# Patient Record
Sex: Female | Born: 1975
Health system: Southern US, Community
[De-identification: ages and names within clinical notes are randomized; demographics above are authoritative.]

## PROBLEM LIST (undated history)

## (undated) DIAGNOSIS — Z9079 Acquired absence of other genital organ(s): Secondary | ICD-10-CM

## (undated) DIAGNOSIS — E079 Disorder of thyroid, unspecified: Secondary | ICD-10-CM

## (undated) DIAGNOSIS — C801 Malignant (primary) neoplasm, unspecified: Secondary | ICD-10-CM

## (undated) DIAGNOSIS — F32A Depression, unspecified: Secondary | ICD-10-CM

## (undated) DIAGNOSIS — F419 Anxiety disorder, unspecified: Secondary | ICD-10-CM

## (undated) DIAGNOSIS — F329 Major depressive disorder, single episode, unspecified: Secondary | ICD-10-CM

## (undated) DIAGNOSIS — T7840XA Allergy, unspecified, initial encounter: Secondary | ICD-10-CM

## (undated) DIAGNOSIS — Z789 Other specified health status: Secondary | ICD-10-CM

## (undated) HISTORY — DX: Depression, unspecified: F32.A

## (undated) HISTORY — DX: Malignant (primary) neoplasm, unspecified: C80.1

## (undated) HISTORY — DX: Major depressive disorder, single episode, unspecified: F32.9

## (undated) HISTORY — DX: Allergy, unspecified, initial encounter: T78.40XA

## (undated) HISTORY — DX: Anxiety disorder, unspecified: F41.9

## (undated) HISTORY — PX: DIAGNOSTIC LAPAROSCOPY: SUR761

## (undated) HISTORY — PX: OVARIAN CYST SURGERY: SHX726

## (undated) HISTORY — DX: Acquired absence of other genital organ(s): Z90.79

## (undated) HISTORY — DX: Disorder of thyroid, unspecified: E07.9

---

## 2007-06-04 DIAGNOSIS — C801 Malignant (primary) neoplasm, unspecified: Secondary | ICD-10-CM

## 2007-06-04 DIAGNOSIS — F419 Anxiety disorder, unspecified: Secondary | ICD-10-CM

## 2007-06-04 HISTORY — DX: Anxiety disorder, unspecified: F41.9

## 2007-06-04 HISTORY — DX: Malignant (primary) neoplasm, unspecified: C80.1

## 2010-09-27 ENCOUNTER — Inpatient Hospital Stay (HOSPITAL_COMMUNITY)
Admission: AD | Admit: 2010-09-27 | Discharge: 2010-09-29 | DRG: 774 | Disposition: A | Discharge status: Home / Self Care | Payer: PRIVATE HEALTH INSURANCE | Source: Ambulatory Visit | Attending: Obstetrics and Gynecology | Admitting: Obstetrics and Gynecology

## 2010-09-27 DIAGNOSIS — O34219 Maternal care for unspecified type scar from previous cesarean delivery: Principal | ICD-10-CM | POA: Diagnosis present

## 2010-09-27 DIAGNOSIS — O4290 Premature rupture of membranes, unspecified as to length of time between rupture and onset of labor, unspecified weeks of gestation: Secondary | ICD-10-CM | POA: Diagnosis present

## 2010-09-27 DIAGNOSIS — D649 Anemia, unspecified: Secondary | ICD-10-CM | POA: Diagnosis not present

## 2010-09-27 DIAGNOSIS — O9903 Anemia complicating the puerperium: Secondary | ICD-10-CM | POA: Diagnosis not present

## 2010-09-27 DIAGNOSIS — D696 Thrombocytopenia, unspecified: Secondary | ICD-10-CM | POA: Diagnosis not present

## 2010-09-27 DIAGNOSIS — D689 Coagulation defect, unspecified: Secondary | ICD-10-CM | POA: Diagnosis not present

## 2010-09-27 LAB — CBC
HCT: 36.4 % (ref 36.0–46.0)
MCH: 29.3 pg (ref 26.0–34.0)
MCH: 29.6 pg (ref 26.0–34.0)
MCHC: 32.2 g/dL (ref 30.0–36.0)
MCV: 91.2 fL (ref 78.0–100.0)
MCV: 91.8 fL (ref 78.0–100.0)
Platelets: 125 10*3/uL — ABNORMAL LOW (ref 150–400)
Platelets: 129 10*3/uL — ABNORMAL LOW (ref 150–400)
RBC: 3.68 MIL/uL — ABNORMAL LOW (ref 3.87–5.11)
RDW: 13.4 % (ref 11.5–15.5)

## 2010-09-28 LAB — CBC
HCT: 31.1 % — ABNORMAL LOW (ref 36.0–46.0)
MCHC: 31.8 g/dL (ref 30.0–36.0)
MCV: 92.6 fL (ref 78.0–100.0)
Platelets: 108 10*3/uL — ABNORMAL LOW (ref 150–400)
RDW: 13.6 % (ref 11.5–15.5)
WBC: 11.5 10*3/uL — ABNORMAL HIGH (ref 4.0–10.5)

## 2011-04-11 ENCOUNTER — Other Ambulatory Visit: Payer: Self-pay | Admitting: Obstetrics and Gynecology

## 2011-04-12 ENCOUNTER — Encounter (HOSPITAL_COMMUNITY): Payer: Self-pay | Admitting: *Deleted

## 2011-04-12 ENCOUNTER — Encounter (HOSPITAL_COMMUNITY): Payer: Self-pay | Admitting: Pharmacist

## 2011-04-18 NOTE — H&P (Addendum)
NAME:  Samantha Hayes, Samantha Hayes NO.:  [id number]  MEDICAL RECORD NO.:  [id number]  LOCATION:  [facility]                         FACILITY:  [facility]  PHYSICIAN:  Osborn Coho, M.D.   DATE OF BIRTH:  Oct 09, 1975  DATE OF ADMISSION:  04/10/2011 DATE OF DISCHARGE:                             HISTORY & PHYSICAL   HISTORY OF PRESENT ILLNESS:  Ms. Breslin is a 35 year old married white female, para 3-0-0-3, presenting for cold knife conization of her cervix because of CIN-I/III/carcinoma in situ of the cervix.  The patient had a history of normal Pap smears until September 2012 when her Pap smear returned showing low-grade squamous intraepithelial lesion.  The patient's colposcopy with biopsy following that Pap smear returned showing CIN-I/CIN-III/carcinoma in situ of the cervix.  The patient denies any postcoital bleeding, intermenstrual bleeding, changes in her bowel movements or urinary tract symptoms.  She does admit to random left lower quadrant pain, which she describes as dull and having occurred over the past month.  Given the patient's findings on cervical biopsy, she has consented to proceed with cold knife conization of her cervix.  PAST MEDICAL HISTORY:  OB history gravida 3, para 3-0-0-3.  The patient had a cesarean section in 2005 and spontaneous vaginal deliveries in 2002 and 2012.  The patient is currently breast feeding.  GYN HISTORY:  Menarche 35 years old.  Last menstrual period March 24, 2011.  She is using Lo Loestrin as her method of contraception.  She has a history of the human papilloma virus, however, has not had abnormal Pap smears until her current Pap smear as described in history of present illness.  MEDICAL HISTORY:  Asthma and depression.  SURGICAL HISTORY:  2009, laparoscopic left ovarian cystectomy.  Denies any problems with anesthesia or history of blood transfusions.  FAMILY HISTORY:  Asthma, thyroid disease, hypertension, and  diabetes.  SOCIAL HISTORY:  The patient is married and she works as a Scientist, forensic.  HABITS:  She does not use tobacco, alcohol, or illicit drugs.  CURRENT MEDICATIONS:  Lo Loestrin, Fenugreek, and prenatal vitamins.  ALLERGIES:  She has no known drug allergies.  Denies any sensitivities to peanuts, latex, shellfish, or soy.  REVIEW OF SYSTEMS:  The patient does wear reading glasses.  Denies any headache, vision changes, difficulty swallowing, nasal congestion, hearing difficulties, nausea, vomiting or diarrhea, dysuria, hematuria, urinary urgency or frequency, flank pain, myalgias, arthralgias, skin rashes.  She does admit to random pelvic pain, but except as is mentioned in history of present illness, the patient's review of systems is otherwise negative.  PHYSICAL EXAMINATION:  VITAL SIGNS:  Blood pressure 100/68, pulse is 72, respirations 16, temperature 98.1 degrees Fahrenheit orally, weight 157 pounds.  Height 5 feet, 6-1/2 inches tall, body mass index 25.3. NECK:  Supple without masses.  There is no thyromegaly or cervical adenopathy. HEART:  Regular rate and rhythm. LUNGS:  Clear. BACK:  No CVA tenderness. ABDOMEN:  No tenderness, guarding, rebound, masses, or organomegaly. EXTREMITIES:  No clubbing, cyanosis, or edema. PELVIC:  EGBUS is normal.  Vagina shows mild pelvic relaxation.  Cervix is nontender without lesions.  Uterus appears normal size, shape, and consistency without tenderness.  Adnexa without tenderness or masses.  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  DISPOSITION:  A discussion was held with the patient regarding indications for her procedure along with its risks, which include, but are not limited to reaction to anesthesia, damage to adjacent organs, excessive bleeding, infection, scarring, and cervical incompetence.  The patient verbalized understanding of these risks, but has consented  to proceed with a cold knife conization of her cervix on April 22, 2011, at 11:30 a.m.     Savvas Roper J. Lowell Guitar, P.A.-C   ______________________________ Osborn Coho, M.D.    EJP/MEDQ  D:  04/18/2011  T:  04/18/2011  Job:  [id number]  04/22/11 No change in H&P - AYR

## 2011-04-22 ENCOUNTER — Ambulatory Visit (HOSPITAL_COMMUNITY)
Admission: RE | Admit: 2011-04-22 | Discharge: 2011-04-22 | Disposition: A | Discharge status: Home / Self Care | Payer: BC Managed Care – PPO | Source: Ambulatory Visit | Attending: Obstetrics and Gynecology | Admitting: Obstetrics and Gynecology

## 2011-04-22 ENCOUNTER — Other Ambulatory Visit: Payer: Self-pay | Admitting: Obstetrics and Gynecology

## 2011-04-22 ENCOUNTER — Encounter (HOSPITAL_COMMUNITY)
Admission: RE | Disposition: A | Discharge status: Home / Self Care | Payer: Self-pay | Source: Ambulatory Visit | Attending: Obstetrics and Gynecology

## 2011-04-22 ENCOUNTER — Ambulatory Visit (HOSPITAL_COMMUNITY): Payer: BC Managed Care – PPO | Admitting: Anesthesiology

## 2011-04-22 ENCOUNTER — Encounter (HOSPITAL_COMMUNITY): Payer: Self-pay | Admitting: Anesthesiology

## 2011-04-22 ENCOUNTER — Encounter (HOSPITAL_COMMUNITY): Payer: Self-pay | Admitting: *Deleted

## 2011-04-22 DIAGNOSIS — D069 Carcinoma in situ of cervix, unspecified: Secondary | ICD-10-CM | POA: Insufficient documentation

## 2011-04-22 HISTORY — DX: Other specified health status: Z78.9

## 2011-04-22 HISTORY — PX: CERVICAL CONIZATION W/BX: SHX1330

## 2011-04-22 LAB — HCG, SERUM, QUALITATIVE: Preg, Serum: NEGATIVE

## 2011-04-22 LAB — CBC
Hemoglobin: 13 g/dL (ref 12.0–15.0)
MCH: 30.8 pg (ref 26.0–34.0)
MCHC: 32.7 g/dL (ref 30.0–36.0)
Platelets: 211 10*3/uL (ref 150–400)
RBC: 4.22 MIL/uL (ref 3.87–5.11)

## 2011-04-22 SURGERY — CONE BIOPSY, CERVIX
Anesthesia: General | Site: Vagina | Wound class: Clean Contaminated

## 2011-04-22 MED ORDER — ACETIC ACID 4% SOLUTION
Status: DC | PRN
  Administered 2011-04-22: 1 via TOPICAL

## 2011-04-22 MED ORDER — FENTANYL CITRATE 0.05 MG/ML IJ SOLN
INTRAMUSCULAR | Status: AC
  Filled 2011-04-22: qty 5

## 2011-04-22 MED ORDER — LIDOCAINE HCL (CARDIAC) 20 MG/ML IV SOLN
INTRAVENOUS | Status: DC | PRN
  Administered 2011-04-22: 40 mg via INTRAVENOUS

## 2011-04-22 MED ORDER — LACTATED RINGERS IV SOLN
INTRAVENOUS | Status: DC
  Administered 2011-04-22 (×2): via INTRAVENOUS

## 2011-04-22 MED ORDER — MIDAZOLAM HCL 2 MG/2ML IJ SOLN
INTRAMUSCULAR | Status: AC
  Filled 2011-04-22: qty 2

## 2011-04-22 MED ORDER — LIDOCAINE HCL (CARDIAC) 20 MG/ML IV SOLN
INTRAVENOUS | Status: AC
  Filled 2011-04-22: qty 5

## 2011-04-22 MED ORDER — FENTANYL CITRATE 0.05 MG/ML IJ SOLN
INTRAMUSCULAR | Status: AC
  Filled 2011-04-22: qty 2

## 2011-04-22 MED ORDER — ONDANSETRON HCL 4 MG/2ML IJ SOLN
INTRAMUSCULAR | Status: AC
  Filled 2011-04-22: qty 2

## 2011-04-22 MED ORDER — IBUPROFEN 600 MG PO TABS: 600.0000 mg | ORAL_TABLET | Freq: Four times a day (QID) | ORAL | Status: AC | PRN

## 2011-04-22 MED ORDER — FENTANYL CITRATE 0.05 MG/ML IJ SOLN
25.0000 ug | INTRAMUSCULAR | Status: DC | PRN
  Administered 2011-04-22: 50 ug via INTRAVENOUS

## 2011-04-22 MED ORDER — PROPOFOL 10 MG/ML IV EMUL
INTRAVENOUS | Status: DC | PRN
  Administered 2011-04-22: 200 mg via INTRAVENOUS

## 2011-04-22 MED ORDER — MIDAZOLAM HCL 5 MG/5ML IJ SOLN
INTRAMUSCULAR | Status: DC | PRN
  Administered 2011-04-22: 2 mg via INTRAVENOUS

## 2011-04-22 MED ORDER — LUGOLS 5 % PO SOLN
ORAL | Status: DC | PRN
  Administered 2011-04-22: 0.1 mL via ORAL

## 2011-04-22 MED ORDER — KETOROLAC TROMETHAMINE 30 MG/ML IJ SOLN
INTRAMUSCULAR | Status: DC | PRN
  Administered 2011-04-22: 30 mg via INTRAVENOUS

## 2011-04-22 MED ORDER — ONDANSETRON HCL 4 MG/2ML IJ SOLN
INTRAMUSCULAR | Status: DC | PRN
  Administered 2011-04-22: 4 mg via INTRAVENOUS

## 2011-04-22 MED ORDER — FENTANYL CITRATE 0.05 MG/ML IJ SOLN
INTRAMUSCULAR | Status: DC | PRN
  Administered 2011-04-22: 100 ug via INTRAVENOUS
  Administered 2011-04-22: 25 ug via INTRAVENOUS

## 2011-04-22 MED ORDER — FERRIC SUBSULFATE SOLN
Status: DC | PRN
  Administered 2011-04-22: 1 via TOPICAL

## 2011-04-22 MED ORDER — DEXAMETHASONE SODIUM PHOSPHATE 4 MG/ML IJ SOLN
INTRAMUSCULAR | Status: DC | PRN
  Administered 2011-04-22: 10 mg via INTRAVENOUS

## 2011-04-22 MED ORDER — PROPOFOL 10 MG/ML IV EMUL
INTRAVENOUS | Status: AC
  Filled 2011-04-22: qty 20

## 2011-04-22 MED ORDER — HYDROCODONE-ACETAMINOPHEN 5-500 MG PO TABS: 1.0000 | ORAL_TABLET | Freq: Four times a day (QID) | ORAL | Status: AC | PRN

## 2011-04-22 MED ORDER — VASOPRESSIN 20 UNIT/ML IJ SOLN
INTRAVENOUS | Status: DC | PRN
  Administered 2011-04-22: 12:00:00 via INTRAMUSCULAR

## 2011-04-22 MED ORDER — DEXAMETHASONE SODIUM PHOSPHATE 10 MG/ML IJ SOLN
INTRAMUSCULAR | Status: AC
  Filled 2011-04-22: qty 1

## 2011-04-22 SURGICAL SUPPLY — 30 items
APPLICATOR COTTON TIP 6IN STRL (MISCELLANEOUS) ×6 IMPLANT
BLADE SURG 11 STRL SS (BLADE) ×2 IMPLANT
CLOTH BEACON ORANGE TIMEOUT ST (SAFETY) ×2 IMPLANT
CONTAINER PREFILL 10% NBF 60ML (FORM) ×2 IMPLANT
COUNTER NEEDLE 1200 MAGNETIC (NEEDLE) IMPLANT
DRAPE UTILITY XL STRL (DRAPES) IMPLANT
ELECT BALL LEEP 5MM RED (ELECTRODE) ×2 IMPLANT
ELECT REM PT RETURN 9FT ADLT (ELECTROSURGICAL) ×2
ELECTRODE REM PT RTRN 9FT ADLT (ELECTROSURGICAL) ×1 IMPLANT
GAUZE SPONGE 4X4 16PLY XRAY LF (GAUZE/BANDAGES/DRESSINGS) IMPLANT
GLOVE BIO SURGEON STRL SZ7.5 (GLOVE) ×4 IMPLANT
GLOVE BIOGEL PI IND STRL 7.5 (GLOVE) ×1 IMPLANT
GLOVE BIOGEL PI INDICATOR 7.5 (GLOVE) ×1
GOWN PREVENTION PLUS LG XLONG (DISPOSABLE) ×4 IMPLANT
NEEDLE SPNL 22GX3.5 QUINCKE BK (NEEDLE) ×2 IMPLANT
PACK VAGINAL MINOR WOMEN LF (CUSTOM PROCEDURE TRAY) ×2 IMPLANT
PENCIL BUTTON HOLSTER BLD 10FT (ELECTRODE) ×2 IMPLANT
SCOPETTES 8  STERILE (MISCELLANEOUS) ×2
SCOPETTES 8 STERILE (MISCELLANEOUS) ×2 IMPLANT
SPONGE SURGIFOAM ABS GEL 12-7 (HEMOSTASIS) ×2 IMPLANT
SUT VIC AB 0 CT1 18XCR BRD8 (SUTURE) ×1 IMPLANT
SUT VIC AB 0 CT1 27 (SUTURE) ×2
SUT VIC AB 0 CT1 27XBRD ANTBC (SUTURE) ×2 IMPLANT
SUT VIC AB 0 CT1 8-18 (SUTURE) ×1
SYR CONTROL 10ML LL (SYRINGE) ×4 IMPLANT
SYR TB 1ML 27GX1/2 SAFE (SYRINGE) ×1 IMPLANT
SYR TB 1ML 27GX1/2 SAFETY (SYRINGE) ×1
TOWEL OR 17X24 6PK STRL BLUE (TOWEL DISPOSABLE) ×4 IMPLANT
TUBING NON-CON 1/4 X 20 CONN (TUBING) ×2 IMPLANT
YANKAUER SUCT BULB TIP NO VENT (SUCTIONS) ×2 IMPLANT

## 2011-04-22 NOTE — Anesthesia Preprocedure Evaluation (Signed)
Anesthesia Evaluation  Patient identified by MRN, date of birth, ID band Patient awake    Reviewed: Allergy & Precautions, H&P , NPO status , Patient's Chart, lab work & pertinent test results, reviewed documented beta blocker date and time   History of Anesthesia Complications Negative for: history of anesthetic complications  Airway Mallampati: I TM Distance: >3 FB Neck ROM: full    Dental  (+) Teeth Intact and Partial Upper   Pulmonary neg pulmonary ROS,  clear to auscultation  Pulmonary exam normal       Cardiovascular Exercise Tolerance: Good neg cardio ROS regular Normal    Neuro/Psych Negative Neurological ROS  Negative Psych ROS   GI/Hepatic negative GI ROS, Neg liver ROS,   Endo/Other  Negative Endocrine ROS  Renal/GU negative Renal ROS  Genitourinary negative   Musculoskeletal   Abdominal   Peds  Hematology negative hematology ROS (+)   Anesthesia Other Findings   Reproductive/Obstetrics (+) Breast feeding                            Anesthesia Physical Anesthesia Plan  ASA: I  Anesthesia Plan: General   Post-op Pain Management:    Induction:   Airway Management Planned: LMA  Additional Equipment:   Intra-op Plan:   Post-operative Plan:   Informed Consent: I have reviewed the patients History and Physical, chart, labs and discussed the procedure including the risks, benefits and alternatives for the proposed anesthesia with the patient or authorized representative who has indicated his/her understanding and acceptance.   Dental Advisory Given  Plan Discussed with: CRNA and Surgeon  Anesthesia Plan Comments:         Anesthesia Quick Evaluation

## 2011-04-22 NOTE — Op Note (Signed)
Preop Diagnosis: CIN I, CIN III, CIS   Postop Diagnosis: CIN I, CIN III, CIS   Procedure: CONIZATION CERVIX WITH BIOPSY   Anesthesia: General   Anesthesiologist: Amy L. Rodman Pickle, MD   Attending: Purcell Nails, MD   Assistant: N/A  Findings: AW lesion around entire transformation zone  Pathology: Cervical Cone Specimen with stitch at 12 O'Clock.   Fluids: 1700cc  UOP: QS via straight cath prior to procedure  EBL: Minimal  Complications: None  Procedure: The patient was taken to the operating room after the risks, benefits and alternatives were discussed with the patient. The patient verbalized understanding and consent signed and witnessed. The patient was placed under general anesthesia per anesthesiologist and prepped and draped in the normal sterile fashion.  A TIME OUT was performed per protocol.  A bivalve speculum was placed in the patient's vagina and 2 stitches were placed, one at the 3:00 position and one at the 9:00 position. Colposcopy was performed and acetowhite lesions as noted above.  A total of 10 cc of dilute Pitressin was injected in cervix circumferentially. There was 20 units of Pitressin in 100 cc of normal saline. Cold knife cone was performed without difficulty and a stitch placed at 12:00 and specimen sent to pathology. The base of the cervix was cauterized with ball-tipped cautery. Monsel solution was placed to aid hemostasis.  All instruments were removed. Sponge lap and needle count was correct. Patient tolerated the procedure well and was awaiting transfer to the recovery room in good condition.

## 2011-04-22 NOTE — Transfer of Care (Signed)
Immediate Anesthesia Transfer of Care Note  Patient: Samantha Hayes  Procedure(s) Performed:  CONIZATION CERVIX WITH BIOPSY  Patient Location: PACU  Anesthesia Type: General  Level of Consciousness: awake  Airway & Oxygen Therapy: Patient Spontanous Breathing  Post-op Assessment: Report given to PACU RN  Post vital signs: stable  Complications: No apparent anesthesia complications

## 2011-04-23 ENCOUNTER — Encounter (HOSPITAL_COMMUNITY): Payer: Self-pay | Admitting: Obstetrics and Gynecology

## 2011-04-23 NOTE — Anesthesia Postprocedure Evaluation (Signed)
Anesthesia Post Note  Patient: Samantha Hayes  Procedure(s) Performed:  CONIZATION CERVIX WITH BIOPSY  Anesthesia type: General  Patient location: PACU  Post pain: Pain level controlled  Post assessment: Post-op Vital signs reviewed  Last Vitals:  Filed Vitals:   04/22/11 1445  BP:   Pulse:   Temp: 36.8 C  Resp:     Post vital signs: Reviewed  Level of consciousness: sedated  Complications: No apparent anesthesia complications

## 2011-08-27 ENCOUNTER — Encounter (INDEPENDENT_AMBULATORY_CARE_PROVIDER_SITE_OTHER): Payer: BC Managed Care – PPO | Admitting: Obstetrics and Gynecology

## 2011-08-27 DIAGNOSIS — O99345 Other mental disorders complicating the puerperium: Secondary | ICD-10-CM

## 2011-08-27 DIAGNOSIS — N870 Mild cervical dysplasia: Secondary | ICD-10-CM

## 2011-08-27 DIAGNOSIS — D069 Carcinoma in situ of cervix, unspecified: Secondary | ICD-10-CM

## 2011-09-12 ENCOUNTER — Telehealth: Payer: Self-pay | Admitting: Obstetrics and Gynecology

## 2011-09-16 NOTE — Telephone Encounter (Signed)
LM for pt that pap results were ASCUS, but HPV not detected. I told her I'd show results to Dr. Su Hilt for plan of care and get back to her. JO CMA

## 2011-09-16 NOTE — Telephone Encounter (Signed)
cht to [employer]

## 2011-09-18 ENCOUNTER — Telehealth: Payer: Self-pay

## 2011-09-18 NOTE — Telephone Encounter (Signed)
LM again for pt about recent pap results. ASCUS/ HPV not  detected. I had previously told pt I would ask Dr. Su Hilt when she'd like to repeat pap, but then I found the ans. To ? In the chart. Last OV note said repeat pap in 4-6 months. I will still show AR result and if plan is any different, then I will contact pt. Again.  JO CMA

## 2011-09-23 ENCOUNTER — Telehealth: Payer: Self-pay

## 2011-09-23 NOTE — Telephone Encounter (Signed)
LM for pt that she will need repeat pap in 4 months, per AR. Melody Comas A

## 2011-11-12 ENCOUNTER — Ambulatory Visit: Payer: BC Managed Care – PPO | Admitting: Obstetrics and Gynecology

## 2012-02-11 ENCOUNTER — Encounter: Payer: Self-pay | Admitting: Obstetrics and Gynecology

## 2012-02-11 ENCOUNTER — Ambulatory Visit (INDEPENDENT_AMBULATORY_CARE_PROVIDER_SITE_OTHER): Payer: BC Managed Care – PPO | Admitting: Obstetrics and Gynecology

## 2012-02-11 VITALS — BP 102/64 | Resp 16 | Ht 66.5 in | Wt 169.0 lb

## 2012-02-11 DIAGNOSIS — N940 Mittelschmerz: Secondary | ICD-10-CM

## 2012-02-11 DIAGNOSIS — IMO0001 Reserved for inherently not codable concepts without codable children: Secondary | ICD-10-CM

## 2012-02-11 DIAGNOSIS — R87612 Low grade squamous intraepithelial lesion on cytologic smear of cervix (LGSIL): Secondary | ICD-10-CM

## 2012-02-11 DIAGNOSIS — D069 Carcinoma in situ of cervix, unspecified: Secondary | ICD-10-CM | POA: Insufficient documentation

## 2012-02-11 DIAGNOSIS — Z309 Encounter for contraceptive management, unspecified: Secondary | ICD-10-CM

## 2012-02-11 MED ORDER — NORETHIN-ETH ESTRAD-FE BIPHAS 1 MG-10 MCG / 10 MCG PO TABS: 1.0000 | ORAL_TABLET | Freq: Every day | ORAL | Status: DC

## 2012-02-11 NOTE — Addendum Note (Signed)
Addended by: Osborn Coho on: 02/11/2012 09:47 AM   Modules accepted: Level of Service

## 2012-02-11 NOTE — Progress Notes (Addendum)
H/o Cin3/cis s/p CKC 04/2011 Wants refill of OCPs C/o midcycle ovulatory discomfort spont resolution  Filed Vitals:   02/11/12 0921  BP: 102/64  Resp: 16   ROS: noncontributory  Pelvic exam:  VULVA: normal appearing vulva with no masses, tenderness or lesions,  VAGINA: normal appearing vagina with normal color and discharge, no lesions, CERVIX: normal appearing cervix without discharge or lesions,  UTERUS: uterus is normal size, shape, consistency and nontender,  ADNEXA: normal adnexa in size, nontender and no masses.   A/P AEX and repeat pap in Refill Loestrin 24 Fe If pain recurs and persists, sched u/s (pt reports h/o cysts) - likely mittelschmerz returning with decline in BF'ing.

## 2012-02-12 LAB — PAP IG W/ RFLX HPV ASCU

## 2012-02-18 ENCOUNTER — Telehealth: Payer: Self-pay | Admitting: Obstetrics and Gynecology

## 2012-02-18 NOTE — Telephone Encounter (Signed)
TRIAGE/BC REQ

## 2012-02-19 ENCOUNTER — Telehealth: Payer: Self-pay

## 2012-02-19 NOTE — Telephone Encounter (Signed)
Spoke to pharmacist about [facility] RX needing prior auth. Per protocol/ AR, pt can have whatever will be covered by her insurance. Microgestin FE was replaced for other pill that was not covered. Sig 1 po qd as directed w/ RF's through 02/2013.  Pt notified. Samantha Hayes A

## 2012-02-27 ENCOUNTER — Telehealth: Payer: Self-pay | Admitting: Obstetrics and Gynecology

## 2012-02-27 NOTE — Telephone Encounter (Signed)
Spoke with pt rgd pap informed pap wnl repeat pap in 6 months per AR note pt voice understanding

## 2014-06-03 DIAGNOSIS — E079 Disorder of thyroid, unspecified: Secondary | ICD-10-CM

## 2014-06-03 HISTORY — DX: Disorder of thyroid, unspecified: E07.9

## 2017-01-27 ENCOUNTER — Ambulatory Visit (INDEPENDENT_AMBULATORY_CARE_PROVIDER_SITE_OTHER): Payer: BLUE CROSS/BLUE SHIELD | Admitting: Family Medicine

## 2017-01-27 ENCOUNTER — Encounter: Payer: Self-pay | Admitting: Family Medicine

## 2017-01-27 VITALS — BP 108/63 | HR 73 | Temp 98.3°F | Ht 66.5 in | Wt 187.0 lb

## 2017-01-27 DIAGNOSIS — E039 Hypothyroidism, unspecified: Secondary | ICD-10-CM

## 2017-01-27 DIAGNOSIS — F320 Major depressive disorder, single episode, mild: Secondary | ICD-10-CM | POA: Diagnosis not present

## 2017-01-27 MED ORDER — LEVOTHYROXINE SODIUM 75 MCG PO TABS: 75.0000 ug | ORAL_TABLET | Freq: Every day | ORAL | 3 refills | Status: DC

## 2017-01-27 MED ORDER — BUPROPION HCL ER (XL) 150 MG PO TB24: 150.0000 mg | ORAL_TABLET | Freq: Every day | ORAL | 2 refills | Status: DC

## 2017-01-27 NOTE — Progress Notes (Signed)
BP 108/63   Pulse 73   Temp 98.3 F (36.8 C) (Oral)   Ht 5' 6.5" (1.689 m)   Wt 187 lb (84.8 kg)   BMI 29.73 kg/m    Subjective:    Patient ID: Samantha Hayes, female    DOB: 08/15/1975, 41 y.o.   MRN: [id number]  HPI: Samantha Hayes is a 41 y.o. 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  out of synthroid x 2 months.  having problems with anxiety/depression - had been on zoloft in past, still nursing some)   HPI Hypothyroidism Patient is coming in for thyroid recheck today as well. They deny any issues with hair changes or heat or cold problems or diarrhea or constipation. They deny any chest pain or palpitations. They are currently on levothyroxine 75 micrograms, she has been out of her medication for 2 months and has been having increased issues with anxiety and depression and fatigue.   Major depression Patient is coming in to discuss major depression. She says she's been dealing with this or at least 3 or 4 months, she had issues when she was younger but has never been on medications and would like to discuss possibilities. She denies any suicidal ideations but has been feeling very anxious and feelings of depression and sadness and fatigue. Depression screen PHQ 2/9 01/27/2017  Decreased Interest 1  Down, Depressed, Hopeless 1  PHQ - 2 Score 2  Altered sleeping 0  Tired, decreased energy 1  Change in appetite 0  Feeling bad or failure about yourself  0  Trouble concentrating 2  Moving slowly or fidgety/restless 0  Suicidal thoughts 0  PHQ-9 Score 5  Difficult doing work/chores Not difficult at all     Relevant past medical, surgical, family and social history reviewed and updated as indicated. Interim medical history since our last visit reviewed. Allergies and medications reviewed and updated.  Review of Systems  Constitutional: Positive for fatigue. Negative for  chills and fever.  Respiratory: Negative for chest tightness and shortness of breath.   Cardiovascular: Negative for chest pain and leg swelling.  Musculoskeletal: Negative for back pain and gait problem.  Skin: Negative for rash.  Neurological: Negative for dizziness, light-headedness and headaches.  Psychiatric/Behavioral: Positive for decreased concentration and dysphoric mood. Negative for agitation, behavioral problems, self-injury, sleep disturbance and suicidal ideas. The patient is nervous/anxious.   All other systems reviewed and are negative.  Per HPI unless specifically indicated above  Social History   Social History  . Marital status: Married    Spouse name: N/A  . Number of children: N/A  . Years of education: N/A   Occupational History  . Not on file.   Social History Main Topics  . Smoking status: Never Smoker  . Smokeless tobacco: Never Used  . Alcohol use No  . Drug use: No  . Sexual activity: Yes    Birth control/ protection: Other-see comments     Comment: tubes removed   Other Topics Concern  . Not on file   Social History Narrative  . No narrative on file    Past Surgical History:  Procedure Laterality Date  . CERVICAL CONIZATION W/BX  04/22/2011   Procedure: CONIZATION CERVIX WITH BIOPSY;  Surgeon: Delice Lesch, MD;  Location: [facility];  Service: Gynecology;  Laterality: N/A;  . CESAREAN SECTION  2005  . DIAGNOSTIC LAPAROSCOPY    .  OVARIAN CYST SURGERY Left     Family History  Problem Relation Age of Onset  . Asthma Mother   . Depression Mother   . Diabetes Mother   . Vision loss Maternal Grandmother   . Kidney disease Maternal Grandfather     Allergies as of 01/27/2017   No Known Allergies     Medication List       Accurate as of 01/27/17  9:30 AM. Always use your most recent med list.          buPROPion 150 MG 24 hr tablet Commonly known as:  WELLBUTRIN XL Take 1 tablet (150 mg total) by mouth daily.   levothyroxine 75  MCG tablet Commonly known as:  SYNTHROID, LEVOTHROID Take 1 tablet (75 mcg total) by mouth daily.            Discharge Care Instructions        Start     Ordered   01/27/17 0000  levothyroxine (SYNTHROID, LEVOTHROID) 75 MCG tablet  Daily     01/27/17 0928   01/27/17 0000  buPROPion (WELLBUTRIN XL) 150 MG 24 hr tablet  Daily     01/27/17 0928         Objective:    BP 108/63   Pulse 73   Temp 98.3 F (36.8 C) (Oral)   Ht 5' 6.5" (1.689 m)   Wt 187 lb (84.8 kg)   BMI 29.73 kg/m   Wt Readings from Last 3 Encounters:  01/27/17 187 lb (84.8 kg)  02/11/12 169 lb (76.7 kg)  04/22/11 144 lb (65.3 kg)    Physical Exam  Constitutional: She is oriented to person, place, and time. She appears well-developed and well-nourished. No distress.  Eyes: Conjunctivae are normal.  Neck: Neck supple. No thyromegaly present.  Cardiovascular: Normal rate, regular rhythm, normal heart sounds and intact distal pulses.   No murmur heard. Pulmonary/Chest: Effort normal and breath sounds normal. No respiratory distress. She has no wheezes. She has no rales.  Musculoskeletal: Normal range of motion.  Lymphadenopathy:    She has no cervical adenopathy.  Neurological: She is alert and oriented to person, place, and time. Coordination normal.  Skin: Skin is warm and dry. No rash noted. She is not diaphoretic.  Psychiatric: Her behavior is normal. Judgment normal. Her mood appears anxious. She exhibits a depressed mood. She expresses no suicidal ideation. She expresses no suicidal plans.  Nursing note and vitals reviewed.     Assessment & Plan:   Problem List Items Addressed This Visit      Endocrine   Hypothyroidism   Relevant Medications   levothyroxine (SYNTHROID, LEVOTHROID) 75 MCG tablet    Other Visit Diagnoses    Depression, major, single episode, mild ([place])    -  Primary   Relevant Medications   buPROPion (WELLBUTRIN XL) 150 MG 24 hr tablet       Follow up plan: Return in  about 6 weeks (around 03/10/2017), or if symptoms worsen or fail to improve, for depression and thyroid recheck.  Caryl Pina, MD [facility] 01/27/2017, 9:30 AM

## 2017-02-11 NOTE — Progress Notes (Signed)
Subjective: CC:?poison oak PCP: Dettinger, Fransisca Kaufmann, MD Samantha Hayes is a 41 y.o. female presenting to clinic today for:  1. Rash Patient reports that she was pulling weeds last Thursday and developed a rash on bilateral forearms on Saturday. She notes that vesicles formed over the weekend. She has been applying over-the-counter topical poison ivy/poison oak treatment. She also washed with a poison ivy soap. Neither of these therapies have been useful. She notes intense pruritus, citing that it is keeping her awake at nighttime. No other lesions elsewhere on the body. She notes is the first time she's ever had poison oak. No shortness of breath, fevers, chills. She did have some nausea after an endometrial biopsy earlier this week. She also notes that one of the lesions on the left forearm has some redness that has been spreading. 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 Currently she has not been working since the birth of her daughter. No exposure to MRSA that she knows of. No known drug allergies. Past medical history significant for hypothyroidism and depression. She is on Synthroid and Wellbutrin for this. No other meds. Additionally, she is still actively breast-feeding her daughter.  No Known Allergies Past Medical History:  Diagnosis Date  . Allergy   . Anxiety 2009  . Cancer [facility]) 2009   cervical  . Depression   . No pertinent past medical history   . Status post surgical removal of both fallopian tubes   . Thyroid disease 2016   Family History  Problem Relation Age of Onset  . Asthma Mother   . Depression Mother   . Diabetes Mother   . Vision loss Maternal Grandmother   . Kidney disease Maternal Grandfather    Social Hx: non smoker.Current medications reviewed.   ROS: Per HPI  Objective: Office vital signs reviewed. BP 104/62   Pulse 75   Temp 98 F (36.7 C) (Oral)   Ht 5' 6.5" (1.689 m)   Wt 184 lb (83.5 kg)   BMI 29.25 kg/m    Physical Examination:  General: Awake, alert, well nourished, nontoxic appearing, No acute distress HEENT: Normal    Throat: moist mucus membranes, no erythema, no tonsillar exudate.  Airway is patent Cardio: regular rate, +2 DP Pulm: normal work of breathing on room air Extremities: warm, well perfused, No edema, cyanosis or clubbing; +2 pulses bilaterally. See skin below Skin: dry; cluster of vesicles appreciated on the left mid-ventral forearm w/ surrounding area of erythema (~2 inches in diameter), she has 2 smaller vesicles along the ulnar aspect of left forearm. Right forearm with several small vesicles with no associated erythema.  No purulence from any of the lesions.  No crust formation.  Nonbleeding. Nontender. Nonfluctuant. Nonindurated.   Assessment/ Plan: 41 y.o. female   1. Allergic contact dermatitis due to plants, except food Concern for poison oak dermatitis. Because vesicles or artery formed, hypodensity steroid would be of little use at this point. It sounds like her symptoms are keeping her up at nighttime/interfering with daily life. Therefore, a long taper of oral steroids were prescribed. She is breast-feeding and I advised her that she should wait 4 hours after taking medication before breast-feeding. I advised to keep area clean.  I informed her that poison oak often does not spread person-to-person once the plant oils have been cleared from skin. She may go back to her normal routine. Additionally, I have concern for early cellulitis on the left upper chest that  he see below. - predniSONE (DELTASONE) 20 MG tablet; Take 2 tablets (40mg ) by mouth x7 days, then 1 tablet (20mg ) x7 days, then 1/2 tab (10mg ) x7days. Then stop.  Dispense: 25 tablet; Refill: 0  2. Cellulitis of left upper extremity She is afebrile on exam. Vital signs stable. No evidence of systemic infection. Left forearm lesion with mild to moderate erythema surrounding vesicle. There is no purulence,  increased warmth, bleeding, or tenderness to palpation. At this point, she does not have a large risk for MRSA exposure. Therefore, I have prescribed her Keflex to take 4 times a day for the next 5 days. This should be safe with breast-feeding. Should return precautions were reviewed with patient. She voiced good understanding. She will follow up as needed with primary care doctor. - cephALEXin (KEFLEX) 500 MG capsule; Take 1 capsule (500 mg total) by mouth 4 (four) times daily.  Dispense: 20 capsule; Refill: 0   Meds ordered this encounter  Medications  . predniSONE (DELTASONE) 20 MG tablet    Sig: Take 2 tablets (40mg ) by mouth x7 days, then 1 tablet (20mg ) x7 days, then 1/2 tab (10mg ) x7days. Then stop.    Dispense:  25 tablet    Refill:  0  . cephALEXin (KEFLEX) 500 MG capsule    Sig: Take 1 capsule (500 mg total) by mouth 4 (four) times daily.    Dispense:  20 capsule    Refill:  [place], DO [place] [phone]

## 2017-02-12 ENCOUNTER — Ambulatory Visit (INDEPENDENT_AMBULATORY_CARE_PROVIDER_SITE_OTHER): Payer: BLUE CROSS/BLUE SHIELD | Admitting: Family Medicine

## 2017-02-12 VITALS — BP 104/62 | HR 75 | Temp 98.0°F | Ht 66.5 in | Wt 184.0 lb

## 2017-02-12 DIAGNOSIS — L237 Allergic contact dermatitis due to plants, except food: Secondary | ICD-10-CM | POA: Diagnosis not present

## 2017-02-12 DIAGNOSIS — L03114 Cellulitis of left upper limb: Secondary | ICD-10-CM | POA: Diagnosis not present

## 2017-02-12 MED ORDER — PREDNISONE 20 MG PO TABS: ORAL_TABLET | ORAL | 0 refills | Status: DC

## 2017-02-12 MED ORDER — CEPHALEXIN 500 MG PO CAPS: 500.0000 mg | ORAL_CAPSULE | Freq: Four times a day (QID) | ORAL | 0 refills | Status: AC

## 2017-02-12 NOTE — Patient Instructions (Addendum)
I have sent in a long taper of oral steroids and an oral antibiotic for what appears to be an early cellulitis on the left arm. I recommended taking the oral steroid each morning, as this can cause insomnia. This may also cause increased appetite. If you notices worsening redness, swelling, pain, pus, please seek immediate medical attention. Otherwise, know that during the first poison oak reaction symptoms may persist for a full 3-4 weeks, especially since the vesicles have already formed.   Poison Oak Dermatitis Poison oak dermatitis is inflammation of the skin that is caused by contact with the allergens on the leaves of the poison oak (toxicodendron) plant. The skin reaction often includes redness, swelling, blisters, and extreme itching. What are the causes? This condition is caused by a specific chemical (urushiol) that is found in the sap of the poison oak plant. This chemical is sticky and it can be easily spread to people, animals, and objects. You can get poison oak dermatitis by:  Having direct contact with a poison oak plant.  Touching animals, other people, or objects that have come in contact with poison oak and have the chemical on them.  What increases the risk? This condition is more likely to develop in people who:  Are outdoors often.  Go outdoors without wearing protective clothing, such as closed shoes, long pants, and a long-sleeved shirt.  What are the signs or symptoms? Symptoms of this condition include:  Redness of the skin.  A rash that may develop blisters.  Extreme itching.  Swelling. This may occur if the reaction is more severe.  Symptoms usually last for 1-2 weeks. However, in some cases, symptoms may last 3-4 weeks.  How is this diagnosed? This condition may be diagnosed based on your symptoms and a physical exam. Your health care provider may also ask you about any recent outdoor activity. How is this treated? Treatment for this condition will vary  depending on how severe it is. Treatment may include:  Hydrocortisone creams or calamine lotions to relieve itching.  Oatmeal baths to soothe the skin.  Over-the-counter antihistamine tablets.  Oral steroid medicine for more severe outbreaks.  Follow these instructions at home:  Take or apply over-the-counter and prescription medicines only as told by your health care provider.  Wash exposed skin as soon as possible with soap and cold water.  Use hydrocortisone creams or calamine lotion as needed to soothe the skin and relieve itching.  Take oatmeal baths as needed. Use colloidal oatmeal. You can get this at your local pharmacy or grocery store. Follow the instructions on the packaging.  Do not scratch or rub your skin.  While you have the rash, wash clothes right after you wear them. How is this prevented?  Learn to identify the poison oak plant and avoid contact with the plant. This plant can be recognized by the number of leaves. Generally, poison oak has three leaves with flowering branches on a single stem. The leaves are often a bit fuzzy and have a toothlike edge.  If you have been exposed to poison oak, thoroughly wash with soap and water right away. You have about 30 minutes to remove the plant resin before it will cause the rash. Be sure to wash under your fingernails because any plant resin there will continue to spread the rash.  When hiking or camping, wear clothes that will help you avoid exposure on the skin. This includes long pants, a long-sleeved shirt, tall socks, and hiking boots. You can  also apply preventive lotion to your skin to help limit exposure.  If you suspect that your clothes or outdoor gear came in contact with poison oak, rinse them off outside with a garden hose before bringing them inside your house. Contact a health care provider if:  You have open sores in the rash area.  You have more redness, swelling, or pain in the affected area.  You  have redness that spreads beyond the rash area.  You have fluid, blood, or pus coming from the affected area.  You have a fever.  You have a rash over a large area of your body.  You have a rash on your eyes, mouth, or genitals.  Your rash does not improve after a few days. Get help right away if:  Your face swells or your eyes swell shut.  You have trouble breathing.  You have trouble swallowing. This information is not intended to replace advice given to you by your health care provider. Make sure you discuss any questions you have with your health care provider. Document Released: 11/24/2002 Document Revised: 10/26/2015 Document Reviewed: 10/26/2014 Elsevier Interactive Patient Education  [employer].

## 2017-03-13 ENCOUNTER — Ambulatory Visit: Payer: BLUE CROSS/BLUE SHIELD | Admitting: Family Medicine

## 2017-03-14 ENCOUNTER — Encounter: Payer: Self-pay | Admitting: Family Medicine

## 2017-03-14 ENCOUNTER — Ambulatory Visit (INDEPENDENT_AMBULATORY_CARE_PROVIDER_SITE_OTHER): Payer: BLUE CROSS/BLUE SHIELD | Admitting: Family Medicine

## 2017-03-14 VITALS — BP 120/74 | HR 85 | Temp 98.0°F | Ht 66.5 in | Wt 185.0 lb

## 2017-03-14 DIAGNOSIS — Z131 Encounter for screening for diabetes mellitus: Secondary | ICD-10-CM | POA: Diagnosis not present

## 2017-03-14 DIAGNOSIS — F320 Major depressive disorder, single episode, mild: Secondary | ICD-10-CM | POA: Insufficient documentation

## 2017-03-14 DIAGNOSIS — E039 Hypothyroidism, unspecified: Secondary | ICD-10-CM | POA: Diagnosis not present

## 2017-03-14 DIAGNOSIS — Z23 Encounter for immunization: Secondary | ICD-10-CM

## 2017-03-14 DIAGNOSIS — Z13220 Encounter for screening for lipoid disorders: Secondary | ICD-10-CM | POA: Diagnosis not present

## 2017-03-14 MED ORDER — BUPROPION HCL ER (XL) 300 MG PO TB24: 300.0000 mg | ORAL_TABLET | Freq: Every day | ORAL | 3 refills | Status: DC

## 2017-03-14 NOTE — Progress Notes (Signed)
BP 120/74   Pulse 85   Temp 98 F (36.7 C) (Oral)   Ht 5' 6.5" (1.689 m)   Wt 185 lb (83.9 kg)   BMI 29.41 kg/m    Subjective:    Patient ID: Samantha Hayes, female    DOB: 04-Nov-1975, 41 y.o.   MRN: [id number]  HPI: Samantha Hayes is a 41 y.o. female presenting on 03/14/2017 for Depression (6 week follow up) and Hypothyroidism   HPI Anxiety and depression recheck Patient is coming in for recheck of anxiety and depression. She says she's feeling a lot better he still feels very irritable and short especially with her kids and she is not 100% where she would like to. She denies any side effects from the medication and she's been very happy with it. She is also having because she has been losing weight and would like to continue on would like to maybe increase it to see if it will be better for her. Depression screen [facility] 2/9 03/14/2017 02/12/2017 01/27/2017  Decreased Interest 1 - 1  Down, Depressed, Hopeless 1 0 1  PHQ - 2 Score 2 0 2  Altered sleeping 1 - 0  Tired, decreased energy 0 - 1  Change in appetite 0 - 0  Feeling bad or failure about yourself  0 - 0  Trouble concentrating 1 - 2  Moving slowly or fidgety/restless 0 - 0  Suicidal thoughts 0 - 0  PHQ-9 Score 4 - 5  Difficult doing work/chores Somewhat difficult - Not difficult at all     Thyroid recheck Patient is coming in for a thyroid recheck and says that she is feeling a lot better since having restarted her thyroid medication. She is currently on 75 g. She says she is starting to lose a little weight and having more energy and just all around feeling better. We will recheck her labs today  Relevant past medical, surgical, family and social history reviewed and updated as indicated. Interim medical history since our last visit reviewed. Allergies and medications reviewed and updated.  Review of Systems  Constitutional: Negative for chills, fatigue, fever and unexpected weight change.  Respiratory: Negative  for chest tightness and shortness of breath.   Cardiovascular: Negative for chest pain and leg swelling.  Endocrine: Negative for cold intolerance and heat intolerance.  Skin: Negative for rash.  Neurological: Negative for light-headedness and headaches.  Psychiatric/Behavioral: Positive for decreased concentration and dysphoric mood. Negative for agitation, behavioral problems, self-injury, sleep disturbance and suicidal ideas. The patient is nervous/anxious.   All other systems reviewed and are negative.   Per HPI unless specifically indicated above        Objective:    BP 120/74   Pulse 85   Temp 98 F (36.7 C) (Oral)   Ht 5' 6.5" (1.689 m)   Wt 185 lb (83.9 kg)   BMI 29.41 kg/m   Wt Readings from Last 3 Encounters:  03/14/17 185 lb (83.9 kg)  02/12/17 184 lb (83.5 kg)  01/27/17 187 lb (84.8 kg)    Physical Exam  Constitutional: She is oriented to person, place, and time. She appears well-developed and well-nourished. No distress.  Eyes: Conjunctivae are normal.  Neck: Neck supple. No thyromegaly present.  Cardiovascular: Normal rate, regular rhythm, normal heart sounds and intact distal pulses.   No murmur heard. Pulmonary/Chest: Effort normal and breath sounds normal. No respiratory distress. She has no wheezes. She has no rales.  Musculoskeletal: Normal range of motion. She  exhibits no edema or tenderness.  Lymphadenopathy:    She has no cervical adenopathy.  Neurological: She is alert and oriented to person, place, and time. Coordination normal.  Skin: Skin is warm and dry. No rash noted. She is not diaphoretic.  Psychiatric: Her behavior is normal. Judgment normal. Her mood appears anxious. She exhibits a depressed mood. She expresses no suicidal ideation. She expresses no suicidal plans.  Nursing note and vitals reviewed.     Assessment & Plan:   Problem List Items Addressed This Visit      Endocrine   Hypothyroidism - Primary   Relevant Orders   CBC with  Differential/Platelet   TSH     Other   Depression, major, single episode, mild (HCC)   Relevant Medications   buPROPion (WELLBUTRIN XL) 300 MG 24 hr tablet   Other Relevant Orders   CBC with Differential/Platelet   TSH    Other Visit Diagnoses    Diabetes mellitus screening       Relevant Orders   CMP14+EGFR   Lipid screening       Relevant Orders   Lipid panel       Follow up plan: Return in about 3 months (around 06/14/2017), or if symptoms worsen or fail to improve, for Recheck anxiety and depression and thyroid.  Counseling provided for all of the vaccine components Orders Placed This Encounter  Procedures  . CBC with Differential/Platelet  . CMP14+EGFR  . Lipid panel  . TSH    Caryl Pina, MD [facility] 03/14/2017, 10:26 AM

## 2017-03-16 LAB — CBC WITH DIFFERENTIAL/PLATELET
BASOS ABS: 0 10*3/uL (ref 0.0–0.2)
Basos: 0 %
EOS (ABSOLUTE): 0.2 10*3/uL (ref 0.0–0.4)
EOS: 4 %
HEMATOCRIT: 39 % (ref 34.0–46.6)
HEMOGLOBIN: 12.8 g/dL (ref 11.1–15.9)
IMMATURE GRANULOCYTES: 0 %
Immature Grans (Abs): 0 10*3/uL (ref 0.0–0.1)
Lymphocytes Absolute: 1.3 10*3/uL (ref 0.7–3.1)
Lymphs: 25 %
MCH: 30.5 pg (ref 26.6–33.0)
MCHC: 32.8 g/dL (ref 31.5–35.7)
MCV: 93 fL (ref 79–97)
Monocytes Absolute: 0.4 10*3/uL (ref 0.1–0.9)
Monocytes: 8 %
Neutrophils Absolute: 3.4 10*3/uL (ref 1.4–7.0)
Neutrophils: 63 %
Platelets: 271 10*3/uL (ref 150–379)
RBC: 4.2 x10E6/uL (ref 3.77–5.28)
RDW: 14.2 % (ref 12.3–15.4)
WBC: 5.3 10*3/uL (ref 3.4–10.8)

## 2017-03-16 LAB — CMP14+EGFR
ALBUMIN: 4.5 g/dL (ref 3.5–5.5)
ALT: 44 IU/L — ABNORMAL HIGH (ref 0–32)
AST: 35 IU/L (ref 0–40)
Albumin/Globulin Ratio: 1.7 (ref 1.2–2.2)
Alkaline Phosphatase: 68 IU/L (ref 39–117)
BUN/Creatinine Ratio: 13 (ref 9–23)
BUN: 11 mg/dL (ref 6–20)
Bilirubin Total: 0.6 mg/dL (ref 0.0–1.2)
CALCIUM: 9.3 mg/dL (ref 8.7–10.2)
CO2: 26 mmol/L (ref 20–29)
CREATININE: 0.87 mg/dL (ref 0.57–1.00)
Chloride: 101 mmol/L (ref 96–106)
GFR calc Af Amer: 97 mL/min/{1.73_m2} (ref >59)
GFR, EST NON AFRICAN AMERICAN: 84 mL/min/{1.73_m2} (ref >59)
GLUCOSE: 83 mg/dL (ref 65–99)
Globulin, Total: 2.6 g/dL (ref 1.5–4.5)
Potassium: 3.9 mmol/L (ref 3.5–5.2)
SODIUM: 140 mmol/L (ref 134–144)
Total Protein: 7.1 g/dL (ref 6.0–8.5)

## 2017-03-16 LAB — TSH: TSH: 1.19 u[IU]/mL (ref 0.450–4.500)

## 2017-03-16 LAB — LIPID PANEL
CHOL/HDL RATIO: 2.9 ratio (ref 0.0–4.4)
CHOLESTEROL TOTAL: 149 mg/dL (ref 100–199)
HDL: 51 mg/dL (ref >39)
LDL CALC: 87 mg/dL (ref 0–99)
Triglycerides: 56 mg/dL (ref 0–149)
VLDL CHOLESTEROL CAL: 11 mg/dL (ref 5–40)

## 2017-03-19 ENCOUNTER — Telehealth: Payer: Self-pay | Admitting: Family Medicine

## 2017-03-19 NOTE — Telephone Encounter (Signed)
Faxed

## 2017-04-14 ENCOUNTER — Encounter: Payer: Self-pay | Admitting: *Deleted

## 2017-06-04 ENCOUNTER — Telehealth: Payer: Self-pay | Admitting: Family Medicine

## 2017-06-04 MED ORDER — OSELTAMIVIR PHOSPHATE 75 MG PO CAPS: ORAL_CAPSULE | ORAL | 0 refills | Status: DC

## 2017-06-04 NOTE — Telephone Encounter (Signed)
Patient's children both tested positive for Flu A and are being treated with Tamiflu.  Mom is not having any symptoms and would like Tamiflu sent in as a preventative.  Per Dr. Warrick Parisian, Tamiflu 75 mg every day x 10 days was sent to [facility].  Patient aware.

## 2017-06-04 NOTE — Telephone Encounter (Signed)
He Jan can you look into this some more, where did they test flu positive, why did they not get treatment from the place that she tested them positive.  Get both of the children's weights and date of birth and full names

## 2017-06-19 ENCOUNTER — Encounter: Payer: Self-pay | Admitting: Family Medicine

## 2017-06-19 ENCOUNTER — Ambulatory Visit: Payer: BLUE CROSS/BLUE SHIELD | Admitting: Family Medicine

## 2017-06-19 VITALS — BP 114/71 | HR 75 | Temp 97.9°F | Ht 66.5 in | Wt 183.0 lb

## 2017-06-19 DIAGNOSIS — E039 Hypothyroidism, unspecified: Secondary | ICD-10-CM | POA: Diagnosis not present

## 2017-06-19 DIAGNOSIS — F320 Major depressive disorder, single episode, mild: Secondary | ICD-10-CM | POA: Diagnosis not present

## 2017-06-19 MED ORDER — TRAZODONE HCL 50 MG PO TABS: 25.0000 mg | ORAL_TABLET | Freq: Every evening | ORAL | 3 refills | Status: DC | PRN

## 2017-06-19 NOTE — Progress Notes (Signed)
BP 114/71   Pulse 75   Temp 97.9 F (36.6 C) (Oral)   Ht 5' 6.5" (1.689 m)   Wt 183 lb (83 kg)   BMI 29.09 kg/m    Subjective:    Patient ID: Samantha Hayes, female    DOB: 07-05-1975, 42 y.o.   MRN: [id number]  HPI: Samantha BRUMMET is a 42 y.o. female presenting on 06/19/2017 for Hypothyroidism (3 mo) and Anxiety/Depression (3 mo; thinks medication may be causing some insomnia)   HPI Hypothyroidism recheck Patient is coming in for thyroid recheck today as well. They deny any issues with hair changes or heat or cold problems or diarrhea or constipation. They deny any chest pain or palpitations. They are currently on levothyroxine 37micrograms   Depression recheck Patient is coming in for anxiety depression recheck.  She denies any major anxiety or depression.  She feels like she is doing very well on the Wellbutrin except for she is having some more insomnia and sometimes she wakes up with her mind racing at night and does not sleep as well.  She denies any suicidal ideation. Depression screen [facility] 2/9 06/19/2017 03/14/2017 02/12/2017 01/27/2017  Decreased Interest 0 1 - 1  Down, Depressed, Hopeless 0 1 0 1  PHQ - 2 Score 0 2 0 2  Altered sleeping - 1 - 0  Tired, decreased energy - 0 - 1  Change in appetite - 0 - 0  Feeling bad or failure about yourself  - 0 - 0  Trouble concentrating - 1 - 2  Moving slowly or fidgety/restless - 0 - 0  Suicidal thoughts - 0 - 0  PHQ-9 Score - 4 - 5  Difficult doing work/chores - Somewhat difficult - Not difficult at all     Relevant past medical, surgical, family and social history reviewed and updated as indicated. Interim medical history since our last visit reviewed. Allergies and medications reviewed and updated.  Review of Systems  Constitutional: Negative for chills and fever.  Eyes: Negative for visual disturbance.  Respiratory: Negative for chest tightness and shortness of breath.   Cardiovascular: Negative for chest pain and leg  swelling.  Musculoskeletal: Negative for back pain and gait problem.  Skin: Negative for rash.  Neurological: Negative for light-headedness and headaches.  Psychiatric/Behavioral: Negative for agitation and behavioral problems.  All other systems reviewed and are negative.  Per HPI unless specifically indicated above     Objective:    BP 114/71   Pulse 75   Temp 97.9 F (36.6 C) (Oral)   Ht 5' 6.5" (1.689 m)   Wt 183 lb (83 kg)   BMI 29.09 kg/m   Wt Readings from Last 3 Encounters:  06/19/17 183 lb (83 kg)  03/14/17 185 lb (83.9 kg)  02/12/17 184 lb (83.5 kg)    Physical Exam  Constitutional: She is oriented to person, place, and time. She appears well-developed and well-nourished. No distress.  Eyes: Conjunctivae are normal.  Neck: Neck supple. No thyromegaly present.  Cardiovascular: Normal rate, regular rhythm, normal heart sounds and intact distal pulses.  No murmur heard. Pulmonary/Chest: Effort normal and breath sounds normal. No respiratory distress. She has no wheezes.  Musculoskeletal: Normal range of motion. She exhibits no edema or tenderness.  Lymphadenopathy:    She has no cervical adenopathy.  Neurological: She is alert and oriented to person, place, and time. Coordination normal.  Skin: Skin is warm and dry. No rash noted. She is not diaphoretic.  Psychiatric: She  has a normal mood and affect. Her behavior is normal.  Nursing note and vitals reviewed.     Assessment & Plan:   Problem List Items Addressed This Visit      Endocrine   Hypothyroidism   Relevant Orders   TSH     Other   Depression, major, single episode, mild ([facility]) - Primary   Relevant Medications   traZODone (DESYREL) 50 MG tablet      Follow up plan: Return in about 6 months (around 12/17/2017), or if symptoms worsen or fail to improve, for Thyroid and depression.  Counseling provided for all of the vaccine components Orders Placed This Encounter  Procedures  . TSH    Caryl Pina, MD [facility] 06/19/2017, 10:27 AM

## 2017-06-20 LAB — TSH: TSH: 2.14 u[IU]/mL (ref 0.450–4.500)

## 2017-06-30 ENCOUNTER — Telehealth: Payer: Self-pay | Admitting: Family Medicine

## 2017-06-30 NOTE — Telephone Encounter (Signed)
faxed

## 2017-07-10 ENCOUNTER — Ambulatory Visit (INDEPENDENT_AMBULATORY_CARE_PROVIDER_SITE_OTHER): Payer: BLUE CROSS/BLUE SHIELD

## 2017-07-10 ENCOUNTER — Encounter: Payer: Self-pay | Admitting: Family

## 2017-07-10 ENCOUNTER — Ambulatory Visit: Payer: BLUE CROSS/BLUE SHIELD | Admitting: Family

## 2017-07-10 VITALS — BP 110/58 | HR 78 | Temp 98.3°F | Ht 66.5 in | Wt 183.2 lb

## 2017-07-10 DIAGNOSIS — S9031XA Contusion of right foot, initial encounter: Secondary | ICD-10-CM | POA: Diagnosis not present

## 2017-07-10 DIAGNOSIS — Y92009 Unspecified place in unspecified non-institutional (private) residence as the place of occurrence of the external cause: Secondary | ICD-10-CM | POA: Diagnosis not present

## 2017-07-10 DIAGNOSIS — W19XXXA Unspecified fall, initial encounter: Secondary | ICD-10-CM

## 2017-07-10 DIAGNOSIS — M79671 Pain in right foot: Secondary | ICD-10-CM

## 2017-07-10 DIAGNOSIS — S99921A Unspecified injury of right foot, initial encounter: Secondary | ICD-10-CM

## 2017-07-10 MED ORDER — NAPROXEN 500 MG PO TABS: 500.0000 mg | ORAL_TABLET | Freq: Two times a day (BID) | ORAL | 1 refills | Status: DC

## 2017-07-10 NOTE — Patient Instructions (Signed)
Contusion A contusion is a deep bruise. Contusions are the result of a blunt injury to tissues and muscle fibers under the skin. The injury causes bleeding under the skin. The skin overlying the contusion may turn blue, purple, or yellow. Minor injuries will give you a painless contusion, but more severe contusions may stay painful and swollen for a few weeks. What are the causes? This condition is usually caused by a blow, trauma, or direct force to an area of the body. What are the signs or symptoms? Symptoms of this condition include:  Swelling of the injured area.  Pain and tenderness in the injured area.  Discoloration. The area may have redness and then turn blue, purple, or yellow.  How is this diagnosed? This condition is diagnosed based on a physical exam and medical history. An X-ray, CT scan, or MRI may be needed to determine if there are any associated injuries, such as broken bones (fractures). How is this treated? Specific treatment for this condition depends on what area of the body was injured. In general, the best treatment for a contusion is resting, icing, applying pressure to (compression), and elevating the injured area. This is often called the RICE strategy. Over-the-counter anti-inflammatory medicines may also be recommended for pain control. Follow these instructions at home:  Rest the injured area.  If directed, apply ice to the injured area: ? Put ice in a plastic bag. ? Place a towel between your skin and the bag. ? Leave the ice on for 20 minutes, 2-3 times per day.  If directed, apply light compression to the injured area using an elastic bandage. Make sure the bandage is not wrapped too tightly. Remove and reapply the bandage as directed by your health care provider.  If possible, raise (elevate) the injured area above the level of your heart while you are sitting or lying down.  Take over-the-counter and prescription medicines only as told by your health  care provider. Contact a health care provider if:  Your symptoms do not improve after several days of treatment.  Your symptoms get worse.  You have difficulty moving the injured area. Get help right away if:  You have severe pain.  You have numbness in a hand or foot.  Your hand or foot turns pale or cold. This information is not intended to replace advice given to you by your health care provider. Make sure you discuss any questions you have with your health care provider. Document Released: 02/27/2005 Document Revised: 09/28/2015 Document Reviewed: 10/05/2014 Elsevier Interactive Patient Education  2018 [employer]  

## 2017-07-10 NOTE — Progress Notes (Signed)
   Subjective:    Patient ID: Samantha Hayes, female    DOB: February 12, 1976, 42 y.o.   MRN: [id number]  Foot Injury   The incident occurred 2 days ago. The incident occurred at the pool. The injury mechanism was a fall. The pain is present in the right foot. The quality of the pain is described as aching. The pain is at a severity of 5/10. The pain is mild. The pain has been intermittent since onset. Associated symptoms include an inability to bear weight. Pertinent negatives include no numbness or tingling. She reports no foreign bodies present. The symptoms are aggravated by weight bearing. She has tried ice and acetaminophen for the symptoms. The treatment provided mild relief.      Review of Systems  Neurological: Negative for tingling and numbness.  All other systems reviewed and are negative.      Objective:   Physical Exam  Constitutional: She is oriented to person, place, and time. She appears well-developed and well-nourished. No distress.  HENT:  Head: Normocephalic.  Eyes: Pupils are equal, round, and reactive to light.  Neck: Normal range of motion. Neck supple. No thyromegaly present.  Cardiovascular: Normal rate, regular rhythm, normal heart sounds and intact distal pulses.  No murmur heard. Pulmonary/Chest: Effort normal and breath sounds normal. No respiratory distress. She has no wheezes.  Abdominal: Soft. Bowel sounds are normal. She exhibits no distension. There is no tenderness.  Musculoskeletal: Normal range of motion. She exhibits tenderness.  ecchymosis and tenderness present in right great toe with flexion   Neurological: She is alert and oriented to person, place, and time.  Skin: Skin is warm and dry.  Psychiatric: She has a normal mood and affect. Her behavior is normal. Judgment and thought content normal.  Vitals reviewed.     BP (!) 110/58   Pulse 78   Temp 98.3 F (36.8 C) (Oral)   Ht 5' 6.5" (1.689 m)   Wt 183 lb 3.2 oz (83.1 kg)   BMI 29.13  kg/m      Assessment & Plan:  1. Injury of right foot, initial encounter - DG Foot Complete Right; Future  2. Contusion of right foot, initial encounter Rest Ice Keep elevated  RTO prn - naproxen (NAPROSYN) 500 MG tablet; Take 1 tablet (500 mg total) by mouth 2 (two) times daily with a meal.  Dispense: 60 tablet; Refill: 1  3. Fall in home, initial encounter - naproxen (NAPROSYN) 500 MG tablet; Take 1 tablet (500 mg total) by mouth 2 (two) times daily with a meal.  Dispense: 60 tablet; Refill: [place], FNP

## 2017-07-26 ENCOUNTER — Other Ambulatory Visit: Payer: Self-pay | Admitting: Family Medicine

## 2017-07-26 DIAGNOSIS — E039 Hypothyroidism, unspecified: Secondary | ICD-10-CM

## 2017-09-03 ENCOUNTER — Encounter: Payer: Self-pay | Admitting: Family Medicine

## 2017-09-03 ENCOUNTER — Ambulatory Visit: Payer: BLUE CROSS/BLUE SHIELD | Admitting: Family Medicine

## 2017-09-03 ENCOUNTER — Ambulatory Visit (INDEPENDENT_AMBULATORY_CARE_PROVIDER_SITE_OTHER): Payer: BLUE CROSS/BLUE SHIELD

## 2017-09-03 VITALS — BP 111/70 | HR 79 | Temp 98.7°F | Ht 66.5 in | Wt 183.0 lb

## 2017-09-03 DIAGNOSIS — M79644 Pain in right finger(s): Secondary | ICD-10-CM | POA: Diagnosis not present

## 2017-09-03 DIAGNOSIS — S60152A Contusion of left little finger with damage to nail, initial encounter: Secondary | ICD-10-CM

## 2017-09-03 NOTE — Progress Notes (Signed)
BP 111/70   Pulse 79   Temp 98.7 F (37.1 C) (Oral)   Ht 5' 6.5" (1.689 m)   Wt 183 lb (83 kg)   BMI 29.09 kg/m    Subjective:    Patient ID: Samantha Hayes, female    DOB: 1975/07/30, 42 y.o.   MRN: [id number]  HPI: Samantha Hayes is a 41 y.o. female presenting on 09/03/2017 for Pain and swelling in 5th digit, right hand (injured while moving refrigerator)   HPI Right fifth digit pain Patient was with her husband trying to move a refrigerator out of their basement so they could take it to the down and all the removing that she got her finger stuck between the wall and some component of the refrigerator and it got smashed pretty badly between it.  She says this occurred 2 days ago and the throbbing has just been almost unbearable.  She says the pain is been severe.  She is been taken Tylenol and ibuprofen for it.  She feels like it is a throbbing and pulsating pain.  She denies any redness or warmth but does have bruising under her nail and swelling throughout the end of that right fifth digit.  She is able to move it and extend it and flex it.  She cannot flex it completely because it so swollen but she is able to extend it completely.  Relevant past medical, surgical, family and social history reviewed and updated as indicated. Interim medical history since our last visit reviewed. Allergies and medications reviewed and updated.  Review of Systems  Constitutional: Negative for chills and fever.  Eyes: Negative for visual disturbance.  Respiratory: Negative for chest tightness and shortness of breath.   Cardiovascular: Negative for chest pain and leg swelling.  Musculoskeletal: Positive for arthralgias and joint swelling. Negative for back pain and gait problem.  Skin: Negative for rash.  Psychiatric/Behavioral: Negative for agitation and behavioral problems.  All other systems reviewed and are negative.   Per HPI unless specifically indicated above   Allergies as of  09/03/2017   No Known Allergies     Medication List        Accurate as of 09/03/17  8:53 AM. Always use your most recent med list.          buPROPion 300 MG 24 hr tablet Commonly known as:  WELLBUTRIN XL Take 1 tablet (300 mg total) by mouth daily.   levothyroxine 75 MCG tablet Commonly known as:  SYNTHROID, LEVOTHROID TAKE 1 TABLET BY MOUTH ONCE DAILY   multivitamin tablet Take 1 tablet by mouth daily.   naproxen 500 MG tablet Commonly known as:  NAPROSYN Take 1 tablet (500 mg total) by mouth 2 (two) times daily with a meal.   traZODone 50 MG tablet Commonly known as:  DESYREL Take 0.5-1 tablets (25-50 mg total) by mouth at bedtime as needed for sleep.          Objective:    BP 111/70   Pulse 79   Temp 98.7 F (37.1 C) (Oral)   Ht 5' 6.5" (1.689 m)   Wt 183 lb (83 kg)   BMI 29.09 kg/m   Wt Readings from Last 3 Encounters:  09/03/17 183 lb (83 kg)  07/10/17 183 lb 3.2 oz (83.1 kg)  06/19/17 183 lb (83 kg)    Physical Exam  Constitutional: She is oriented to person, place, and time. She appears well-developed and well-nourished. No distress.  Eyes: Conjunctivae are normal.  Musculoskeletal: She exhibits no edema.       Right hand: She exhibits decreased range of motion (Swelling inhibits flexion but able to extend completely), tenderness and swelling. She exhibits normal capillary refill, no deformity and no laceration. Normal sensation noted.       Hands: Neurological: She is alert and oriented to person, place, and time. Coordination normal.  Skin: Skin is warm and dry. No rash noted. She is not diaphoretic.  Nursing note and vitals reviewed.   Finger x-ray: No acute bony abnormalities  Using an 11 blade to puncture nail plate so that the blood underneath is able to drain.    Assessment & Plan:   Problem List Items Addressed This Visit    None    Visit Diagnoses    Contusion of left little finger with damage to nail, initial encounter    -  Primary     Relevant Orders   DG Finger [place] Right (Completed)       Follow up plan: Return if symptoms worsen or fail to improve.  Counseling provided for all of the vaccine components Orders Placed This Encounter  Procedures  . [place] Right    Caryl Pina, MD [facility] 09/03/2017, 8:53 AM

## 2017-11-21 ENCOUNTER — Encounter: Payer: Self-pay | Admitting: Family

## 2017-11-21 ENCOUNTER — Ambulatory Visit: Payer: BLUE CROSS/BLUE SHIELD | Admitting: Family

## 2017-11-21 VITALS — BP 113/78 | HR 67 | Temp 98.5°F | Ht 66.5 in | Wt 185.0 lb

## 2017-11-21 DIAGNOSIS — M545 Low back pain, unspecified: Secondary | ICD-10-CM

## 2017-11-21 LAB — URINALYSIS, COMPLETE
BILIRUBIN UA: NEGATIVE
Glucose, UA: NEGATIVE
KETONES UA: NEGATIVE
Leukocytes, UA: NEGATIVE
Nitrite, UA: NEGATIVE
PH UA: 6.5 (ref 5.0–7.5)
PROTEIN UA: NEGATIVE
SPEC GRAV UA: 1.02 (ref 1.005–1.030)
Urobilinogen, Ur: 0.2 mg/dL (ref 0.2–1.0)

## 2017-11-21 LAB — MICROSCOPIC EXAMINATION: RENAL EPITHEL UA: NONE SEEN /HPF

## 2017-11-21 MED ORDER — PREDNISONE 10 MG (21) PO TBPK
ORAL_TABLET | ORAL | 0 refills | Status: DC
Start: 2017-11-21 — End: 2023-11-05

## 2017-11-21 MED ORDER — DICLOFENAC SODIUM 75 MG PO TBEC: 75.0000 mg | DELAYED_RELEASE_TABLET | Freq: Two times a day (BID) | ORAL | 0 refills | Status: DC

## 2017-11-21 MED ORDER — CYCLOBENZAPRINE HCL 5 MG PO TABS: 5.0000 mg | ORAL_TABLET | Freq: Three times a day (TID) | ORAL | 2 refills | Status: DC | PRN

## 2017-11-21 NOTE — Progress Notes (Signed)
   Subjective:    Patient ID: Samantha Hayes, female    DOB: Jun 25, 1975, 42 y.o.   MRN: [id number]  Chief Complaint  Patient presents with  . Back Pain    lower    Back Pain  This is a new problem. The current episode started in the past 7 days. The problem occurs intermittently. The problem has been waxing and waning since onset. Pain location: right flank. The quality of the pain is described as aching. Radiates to: right hip. The pain is at a severity of 7/10. The pain is moderate. The symptoms are aggravated by bending, standing and twisting. Pertinent negatives include no bladder incontinence, bowel incontinence, leg pain, tingling or weakness. She has tried bed rest, home exercises and NSAIDs for the symptoms. The treatment provided no relief.      Review of Systems  Gastrointestinal: Negative for bowel incontinence.  Genitourinary: Negative for bladder incontinence.  Musculoskeletal: Positive for back pain.  Neurological: Negative for tingling and weakness.  All other systems reviewed and are negative.      Objective:   Physical Exam  Constitutional: She is oriented to person, place, and time. She appears well-developed and well-nourished. No distress.  HENT:  Head: Normocephalic and atraumatic.  Right Ear: External ear normal.  Left Ear: External ear normal.  Mouth/Throat: Oropharynx is clear and moist.  Eyes: Pupils are equal, round, and reactive to light.  Neck: Normal range of motion. Neck supple. No thyromegaly present.  Cardiovascular: Normal rate, regular rhythm, normal heart sounds and intact distal pulses.  No murmur heard. Pulmonary/Chest: Effort normal and breath sounds normal. No respiratory distress. She has no wheezes.  Abdominal: Soft. Bowel sounds are normal. She exhibits no distension. There is no tenderness.  Musculoskeletal: She exhibits no edema or tenderness.  Pain lower lumbar with flexion, extension, or rotation  Neurological: She is alert and  oriented to person, place, and time. She has normal reflexes. No cranial nerve deficit.  Skin: Skin is warm and dry.  Psychiatric: She has a normal mood and affect. Her behavior is normal. Judgment and thought content normal.  Vitals reviewed.     BP 113/78   Pulse 67   Temp 98.5 F (36.9 C) (Oral)   Ht 5' 6.5" (1.689 m)   Wt 185 lb (83.9 kg)   BMI 29.41 kg/m      Assessment & Plan:  Samantha Hayes was seen today for back pain.  Diagnoses and all orders for this visit:  Acute bilateral low back pain without sciatica -     Urinalysis, Complete -     predniSONE (STERAPRED UNI-PAK 21 TAB) 10 MG (21) TBPK tablet; Use as directed -     diclofenac (VOLTAREN) 75 MG EC tablet; Take 1 tablet (75 mg total) by mouth 2 (two) times daily. -     Urine Culture -     cyclobenzaprine (FLEXERIL) 5 MG tablet; Take 1 tablet (5 mg total) by mouth 3 (three) times daily as needed for muscle spasms.  Rest Ice ROM exercises RTO if symptoms worsen or do not improve  Evelina Dun, FNP

## 2017-11-21 NOTE — Patient Instructions (Signed)

## 2017-11-22 LAB — URINE CULTURE

## 2017-12-29 ENCOUNTER — Ambulatory Visit: Payer: BLUE CROSS/BLUE SHIELD | Admitting: Family Medicine

## 2018-01-08 ENCOUNTER — Ambulatory Visit: Payer: BLUE CROSS/BLUE SHIELD | Admitting: Family Medicine

## 2018-02-19 ENCOUNTER — Encounter: Payer: Self-pay | Admitting: Family

## 2018-02-19 ENCOUNTER — Ambulatory Visit: Payer: BLUE CROSS/BLUE SHIELD | Admitting: Family

## 2018-02-19 VITALS — BP 96/57 | HR 64 | Temp 97.4°F | Ht 66.5 in | Wt 180.0 lb

## 2018-02-19 DIAGNOSIS — M545 Low back pain, unspecified: Secondary | ICD-10-CM

## 2018-02-19 MED ORDER — METHYLPREDNISOLONE ACETATE 80 MG/ML IJ SUSP
80.0000 mg | Freq: Once | INTRAMUSCULAR | Status: AC
  Administered 2018-02-19: 80 mg via INTRAMUSCULAR

## 2018-02-19 MED ORDER — DICLOFENAC SODIUM 75 MG PO TBEC: 75.0000 mg | DELAYED_RELEASE_TABLET | Freq: Two times a day (BID) | ORAL | 0 refills | Status: DC

## 2018-02-19 MED ORDER — CYCLOBENZAPRINE HCL 5 MG PO TABS: 5.0000 mg | ORAL_TABLET | Freq: Three times a day (TID) | ORAL | 2 refills | Status: DC | PRN

## 2018-02-19 MED ORDER — KETOROLAC TROMETHAMINE 60 MG/2ML IM SOLN
60.0000 mg | Freq: Once | INTRAMUSCULAR | Status: AC
  Administered 2018-02-19: 60 mg via INTRAMUSCULAR

## 2018-02-19 NOTE — Progress Notes (Signed)
   Subjective:    Patient ID: Samantha Hayes, female    DOB: 1975-08-11, 42 y.o.   MRN: [id number]  Chief Complaint  Patient presents with  . Back Pain   Pt presents to the office today for lower back pain. PT states her Ortho provider put her in a walking boot in her left foot for plantar fasciitis on Tuesday. She has been walking  In the boot and noticed her pain yesterday. She was given prednisone dose pack, but has not started that because she wanted to talk to me first.   Back Pain  This is a new problem. The current episode started yesterday. The problem occurs constantly. The problem is unchanged. The pain is present in the lumbar spine. The quality of the pain is described as aching. The pain is at a severity of 8/10. The pain is moderate. The symptoms are aggravated by standing and twisting. Pertinent negatives include no bladder incontinence, bowel incontinence or leg pain. She has tried muscle relaxant and NSAIDs for the symptoms. The treatment provided mild relief.      Review of Systems  Gastrointestinal: Negative for bowel incontinence.  Genitourinary: Negative for bladder incontinence.  Musculoskeletal: Positive for back pain.  All other systems reviewed and are negative.      Objective:   Physical Exam  Constitutional: She is oriented to person, place, and time. She appears well-developed and well-nourished. No distress.  HENT:  Head: Normocephalic.  Eyes: Pupils are equal, round, and reactive to light.  Neck: Normal range of motion. Neck supple. No thyromegaly present.  Cardiovascular: Normal rate, regular rhythm, normal heart sounds and intact distal pulses.  No murmur heard. Pulmonary/Chest: Effort normal and breath sounds normal. No respiratory distress. She has no wheezes.  Abdominal: Soft. Bowel sounds are normal. She exhibits no distension. There is no tenderness.  Musculoskeletal: She exhibits tenderness. She exhibits no edema.  Walking boot up to left  knee, pain in lumbar area with rotation   Neurological: She is alert and oriented to person, place, and time. She has normal reflexes. No cranial nerve deficit.  Skin: Skin is warm and dry.  Psychiatric: She has a normal mood and affect. Her behavior is normal. Judgment and thought content normal.  Vitals reviewed.     BP (!) 96/57   Pulse 64   Temp (!) 97.4 F (36.3 C) (Oral)   Ht 5' 6.5" (1.689 m)   Wt 180 lb (81.6 kg)   BMI 28.62 kg/m      Assessment & Plan:  [name] comes in today with chief complaint of Back Pain   Diagnosis and orders addressed:  1. Acute bilateral low back pain without sciatica Rest Ice  Keep Ortho follow up Start Diclofenac, no other NSAID's Sedation precautions discussed with flexeril  RTO if symptoms worsen or do not improve  - cyclobenzaprine (FLEXERIL) 5 MG tablet; Take 1 tablet (5 mg total) by mouth 3 (three) times daily as needed for muscle spasms.  Dispense: 30 tablet; Refill: 2 - diclofenac (VOLTAREN) 75 MG EC tablet; Take 1 tablet (75 mg total) by mouth 2 (two) times daily.  Dispense: 30 tablet; Refill: 0 - methylPREDNISolone acetate (DEPO-MEDROL) injection 80 mg - ketorolac (TORADOL) injection 60 mg  Evelina Dun, FNP

## 2018-02-19 NOTE — Patient Instructions (Signed)
Low Back Strain A strain is a stretch or tear in a muscle or the strong cords of tissue that attach muscle to bone (tendons). Strains of the lower back (lumbar spine) are a common cause of low back pain. A strain occurs when muscles or tendons are torn or are stretched beyond their limits. The muscles may become inflamed, resulting in pain and sudden muscle tightening (spasms). A strain can happen suddenly due to an injury (trauma), or it can develop gradually due to overuse. There are three types of strains:  Grade 1 is a mild strain involving a minor tear of the muscle fibers or tendons. This may cause some pain but no loss of muscle strength.  Grade 2 is a moderate strain involving a partial tear of the muscle fibers or tendons. This causes more severe pain and some loss of muscle strength.  Grade 3 is a severe strain involving a complete tear of the muscle or tendon. This causes severe pain and complete or nearly complete loss of muscle strength. What are the causes? This condition may be caused by:  Trauma, such as a fall or a hit to the body.  Twisting or overstretching the back. This may result from doing activities that require a lot of energy, such as lifting heavy objects. What increases the risk? The following factors may increase your risk of getting this condition:  Playing contact sports.  Participating in sports or activities that put excessive stress on the back and require a lot of bending and twisting, including:  Lifting weights or heavy objects.  Gymnastics.  Soccer.  Figure skating.  Snowboarding.  Being overweight or obese.  Having poor strength and flexibility. What are the signs or symptoms? Symptoms of this condition may include:  Sharp or dull pain in the lower back that does not go away. Pain may extend to the buttocks.  Stiffness.  Limited range of motion.  Inability to stand up straight due to stiffness or pain.  Muscle spasms. How is this  diagnosed?   This condition may be diagnosed based on:  Your symptoms.  Your medical history.  A physical exam.  Your health care provider may push on certain areas of your back to determine the source of your pain.  You may be asked to bend forward, backward, and side to side to assess the severity of your pain and your range of motion.  Imaging tests, such as:  X-rays.  MRI. How is this treated? Treatment for this condition may include:  Applying heat and cold to the affected area.  Medicines to help relieve pain and to relax your muscles (muscle relaxants).  NSAIDs to help reduce swelling and discomfort.  Physical therapy. When your symptoms improve, it is important to gradually return to your normal routine as soon as possible to reduce pain, avoid stiffness, and avoid loss of muscle strength. Generally, symptoms should improve within 6 weeks of treatment. However, recovery time varies. Follow these instructions at home: Managing pain, stiffness, and swelling  If directed, apply ice to the injured area during the first 24 hours after your injury.  Put ice in a plastic bag.  Place a towel between your skin and the bag.  Leave the ice on for 20 minutes, 2-3 times a day.  If directed, apply heat to the affected area as often as told by your health care provider. Use the heat source that your health care provider recommends, such as a moist heat pack or a heating pad.    Place a towel between your skin and the heat source.  Leave the heat on for 20-30 minutes.  Remove the heat if your skin turns bright red. This is especially important if you are unable to feel pain, heat, or cold. You may have a greater risk of getting burned. Activity  Rest and return to your normal activities as told by your health care provider. Ask your health care provider what activities are safe for you.  Avoid activities that take a lot of effort (are strenuous) for as long as told by your  health care provider.  Do exercises as told by your health care provider. General instructions  Take over-the-counter and prescription medicines only as told by your health care provider.  If you have questions or concerns about safety while taking pain medicine, talk with your health care provider.  Do not drive or operate heavy machinery until you know how your pain medicine affects you.  Do not use any tobacco products, such as cigarettes, chewing tobacco, and e-cigarettes. Tobacco can delay bone healing. If you need help quitting, ask your health care provider.  Keep all follow-up visits as told by your health care provider. This is important. How is this prevented?  Warm up and stretch before being active.  Cool down and stretch after being active.  Give your body time to rest between periods of activity.  Avoid:  Being physically inactive for long periods at a time.  Exercising or playing sports when you are tired or in pain.  Use correct form when playing sports and lifting heavy objects.  Use good posture when sitting and standing.  Maintain a healthy weight.  Sleep on a mattress with medium firmness to support your back.  Make sure to use equipment that fits you, including shoes that fit well.  Be safe and responsible while being active to avoid falls.  Do at least 150 minutes of moderate-intensity exercise each week, such as brisk walking or water aerobics. Try a form of exercise that takes stress off your back, such as swimming or stationary cycling.  Maintain physical fitness, including:  Strength.  Flexibility.  Cardiovascular fitness.  Endurance. Contact a health care provider if:  Your back pain does not improve after 6 weeks of treatment.  Your symptoms get worse. Get help right away if:  Your back pain is severe.  You are unable to stand or walk.  You develop pain in your legs.  You develop weakness in your buttocks or legs.  You have  difficulty controlling when you urinate or when you have a bowel movement. This information is not intended to replace advice given to you by your health care provider. Make sure you discuss any questions you have with your health care provider. Document Released: 05/20/2005 Document Revised: 01/25/2016 Document Reviewed: 03/01/2015 Elsevier Interactive Patient Education  2017 [employer]  

## 2018-02-23 ENCOUNTER — Ambulatory Visit: Payer: BLUE CROSS/BLUE SHIELD | Admitting: Family Medicine

## 2018-05-08 ENCOUNTER — Telehealth: Payer: Self-pay | Admitting: Family Medicine

## 2018-05-08 MED ORDER — OSELTAMIVIR PHOSPHATE 75 MG PO CAPS: 75.0000 mg | ORAL_CAPSULE | Freq: Two times a day (BID) | ORAL | 0 refills | Status: DC

## 2018-05-08 NOTE — Telephone Encounter (Signed)
I sent the Tamiflu for, go ahead and start taking it if you develop any symptoms

## 2018-05-08 NOTE — Telephone Encounter (Signed)
Left detailed message advising of MD feedback and to call back with any further questions or concerns.

## 2018-05-08 NOTE — Telephone Encounter (Signed)
Pharmacy: [facility]   PT states that one of her children tested postive for flu b yesterday and is wanting to have tamiflu sent in. Please advise.

## 2018-05-12 ENCOUNTER — Ambulatory Visit: Payer: Self-pay | Admitting: Physical Therapy

## 2019-05-05 IMAGING — DX DG FOOT COMPLETE 3+V*R*
3 series · 3 of 3 positions shown · non-contrast
Comparison: None.

CLINICAL DATA: Pain in the great toe after a fall

EXAM:
RIGHT FOOT COMPLETE - 3+ VIEW

[foot ap]
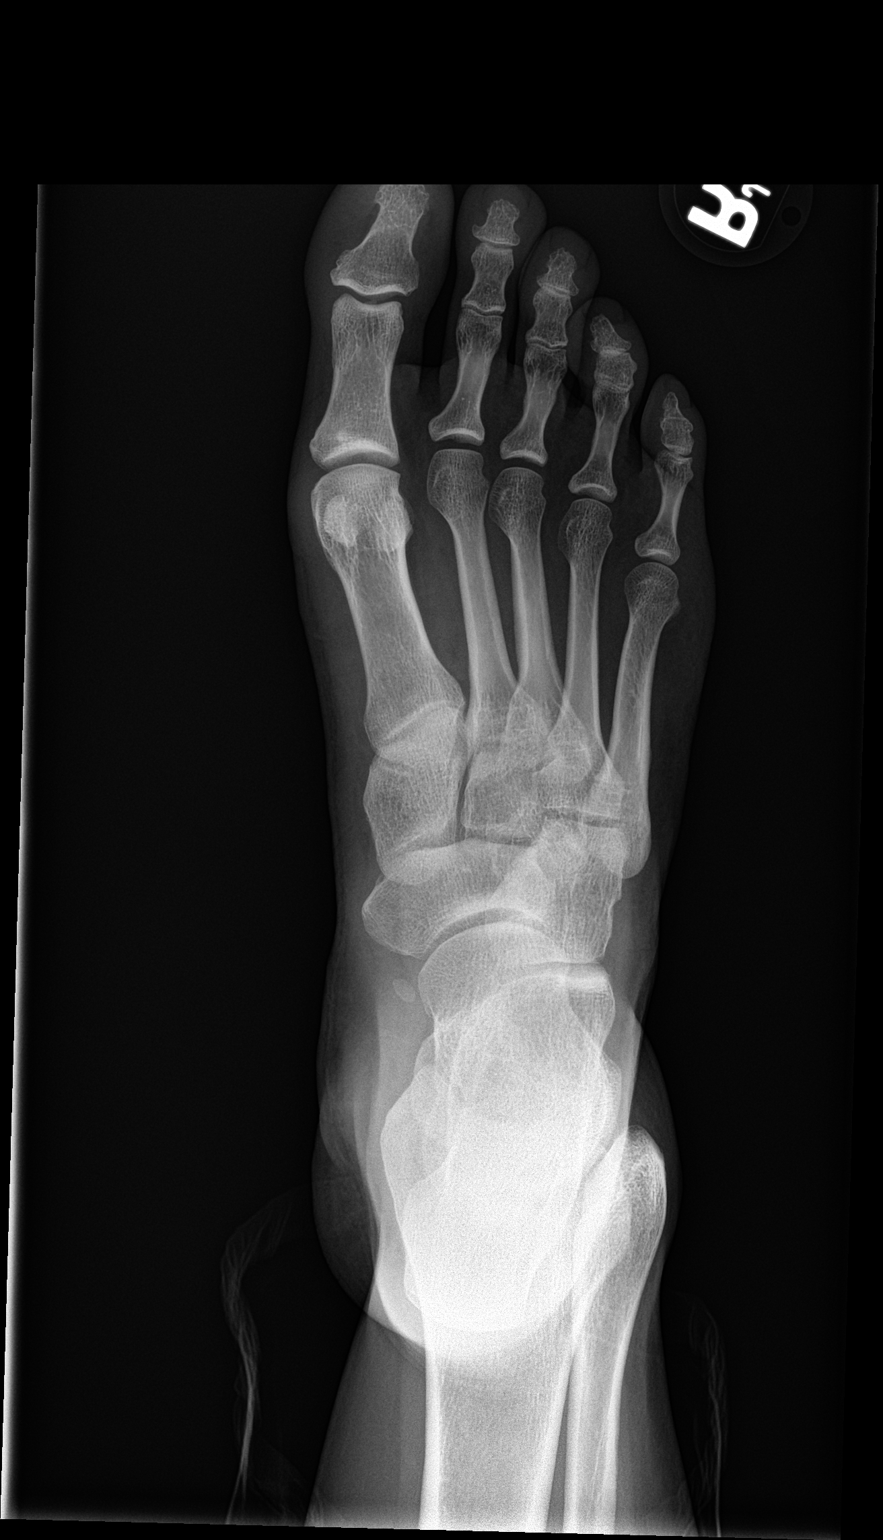

[foot obl]
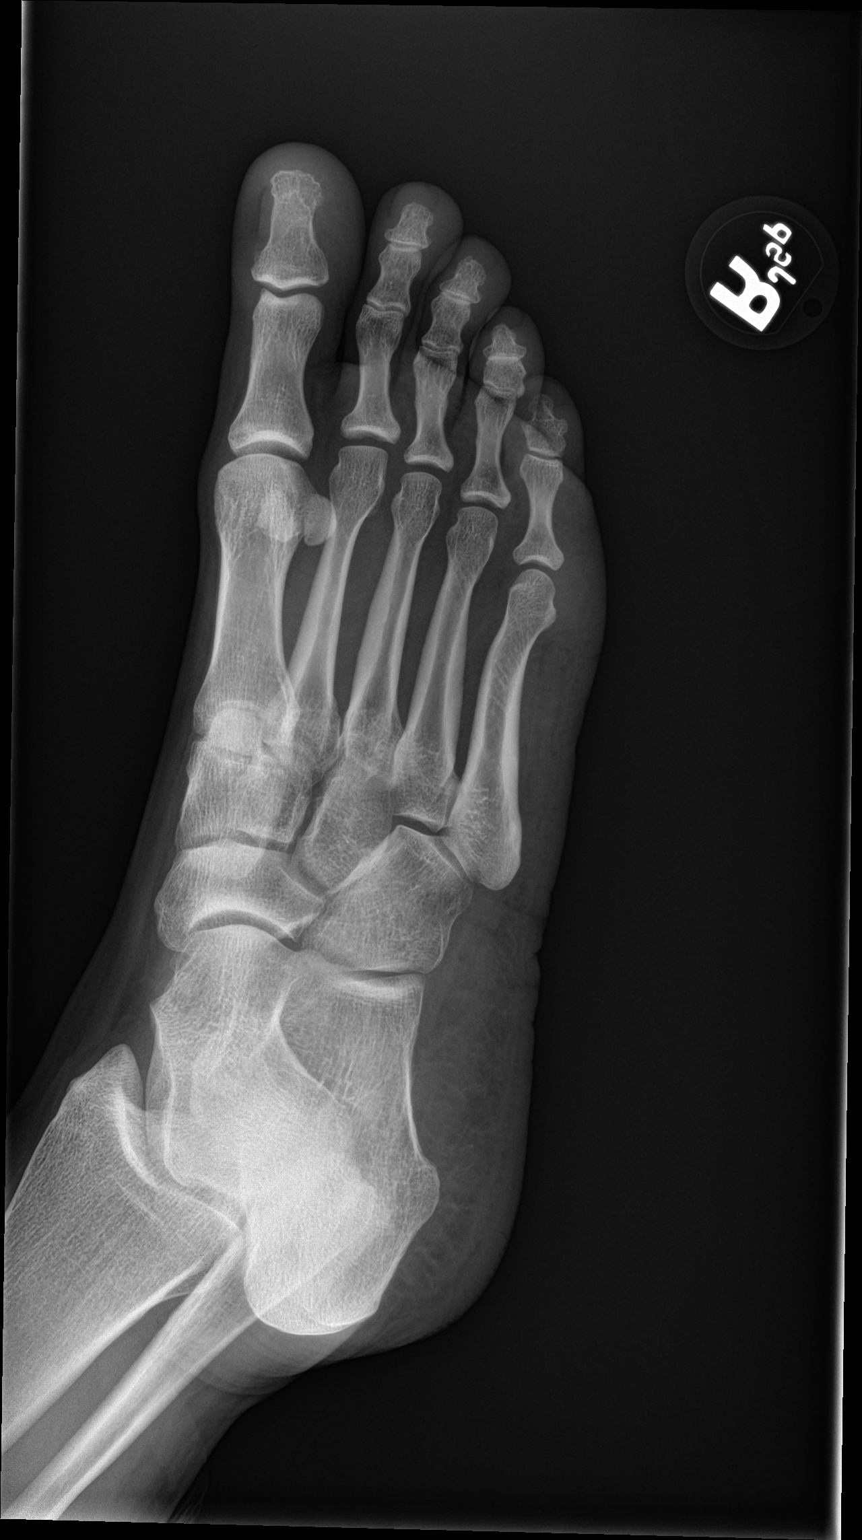

[foot lat]
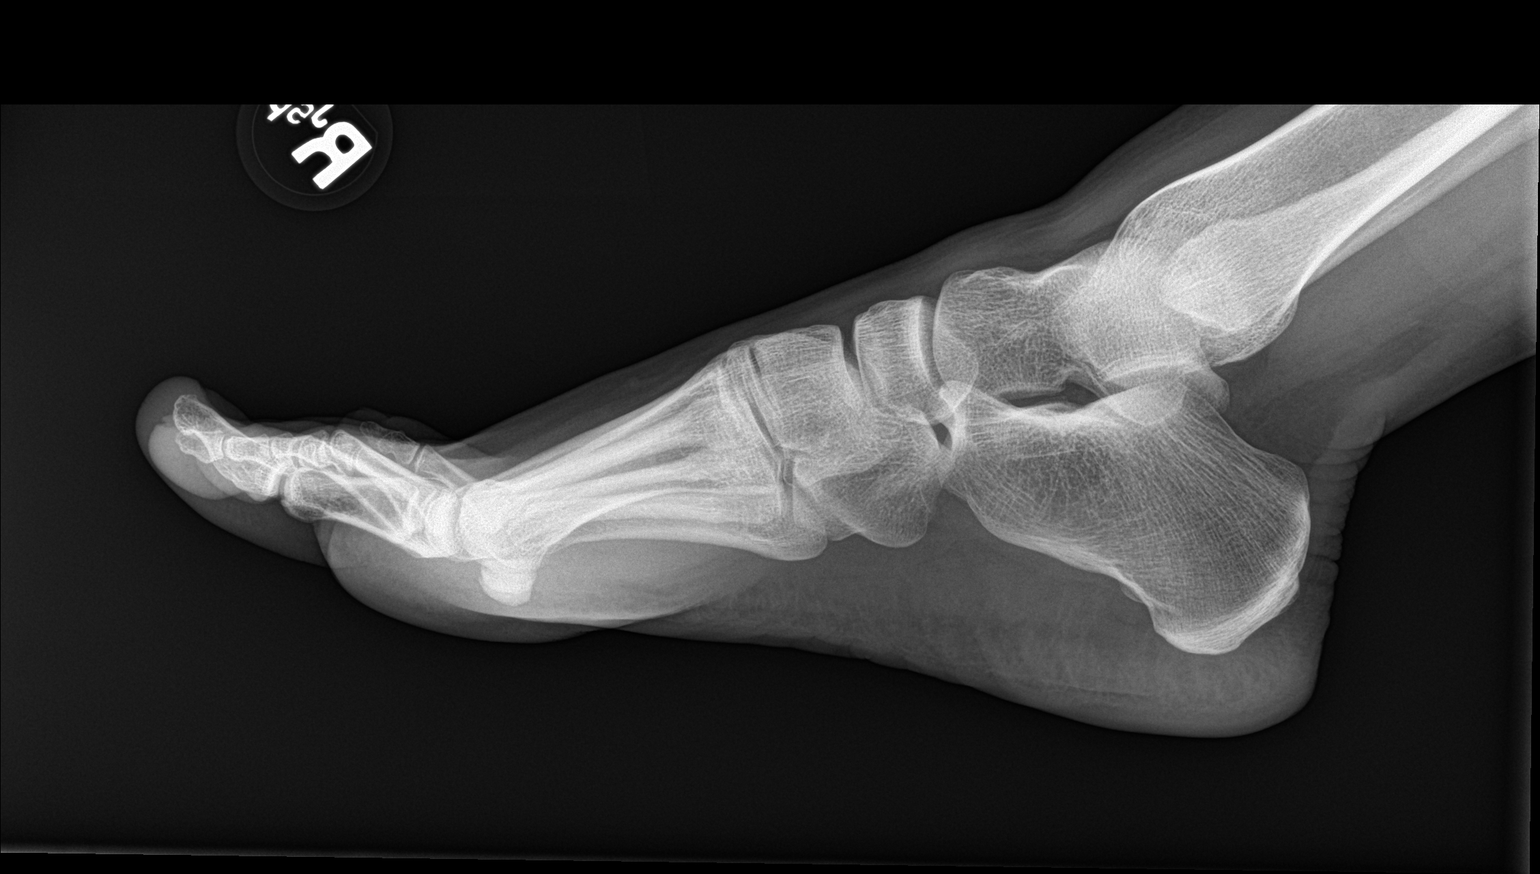

[3 of 3 positions shown; findings below may reference images not displayed]

FINDINGS: No acute fracture is seen. Alignment is normal. Joint spaces appear
normal.
IMPRESSION: Negative.

## 2023-07-15 ENCOUNTER — Other Ambulatory Visit (HOSPITAL_BASED_OUTPATIENT_CLINIC_OR_DEPARTMENT_OTHER): Payer: Self-pay

## 2023-07-15 ENCOUNTER — Telehealth: Payer: Commercial Managed Care - PPO | Admitting: Physician Assistant

## 2023-07-15 DIAGNOSIS — J069 Acute upper respiratory infection, unspecified: Secondary | ICD-10-CM

## 2023-07-15 MED ORDER — FLUTICASONE PROPIONATE 50 MCG/ACT NA SUSP
2.0000 | Freq: Every day | NASAL | 0 refills | Status: DC
  Filled 2023-07-15: qty 16, 30d supply, fill #0

## 2023-07-15 NOTE — Progress Notes (Signed)
I have spent 5 minutes in review of e-visit questionnaire, review and updating patient chart, medical decision making and response to patient.   Piedad Climes, PA-C

## 2023-07-15 NOTE — Progress Notes (Signed)

## 2023-07-16 ENCOUNTER — Other Ambulatory Visit (HOSPITAL_BASED_OUTPATIENT_CLINIC_OR_DEPARTMENT_OTHER): Payer: Self-pay

## 2023-09-10 ENCOUNTER — Other Ambulatory Visit (HOSPITAL_BASED_OUTPATIENT_CLINIC_OR_DEPARTMENT_OTHER): Payer: Self-pay

## 2023-09-10 DIAGNOSIS — M76821 Posterior tibial tendinitis, right leg: Secondary | ICD-10-CM | POA: Diagnosis not present

## 2023-09-10 DIAGNOSIS — Q6689 Other  specified congenital deformities of feet: Secondary | ICD-10-CM | POA: Diagnosis not present

## 2023-09-10 DIAGNOSIS — M79671 Pain in right foot: Secondary | ICD-10-CM | POA: Diagnosis not present

## 2023-09-10 DIAGNOSIS — M6701 Short Achilles tendon (acquired), right ankle: Secondary | ICD-10-CM | POA: Diagnosis not present

## 2023-09-10 DIAGNOSIS — M25571 Pain in right ankle and joints of right foot: Secondary | ICD-10-CM | POA: Diagnosis not present

## 2023-09-10 MED ORDER — MELOXICAM 7.5 MG PO TABS
ORAL_TABLET | ORAL | 0 refills | Status: AC
  Filled 2023-09-10: qty 60, 37d supply, fill #0

## 2023-10-05 ENCOUNTER — Telehealth: Admitting: Family Medicine

## 2023-10-05 DIAGNOSIS — M545 Low back pain, unspecified: Secondary | ICD-10-CM | POA: Diagnosis not present

## 2023-10-05 MED ORDER — BACLOFEN 10 MG PO TABS: 10.0000 mg | ORAL_TABLET | Freq: Three times a day (TID) | ORAL | 0 refills | Status: DC

## 2023-10-05 MED ORDER — PREDNISONE 20 MG PO TABS
20.0000 mg | ORAL_TABLET | Freq: Two times a day (BID) | ORAL | 0 refills | Status: AC
Start: 2023-10-05 — End: 2023-10-10

## 2023-10-05 NOTE — Progress Notes (Signed)
 E-Visit for Back Pain   We are sorry that you are not feeling well.  Here is how we plan to help!  Based on what you have shared with me it looks like you mostly have acute back pain.  Acute back pain is defined as musculoskeletal pain that can resolve in 1-3 weeks with conservative treatment.  I have prescribed prednisone  as well as Baclofen 10 mg every eight hours as needed which is a muscle relaxer  Some patients experience stomach irritation or in increased heartburn with anti-inflammatory drugs.  Please keep in mind that muscle relaxer's can cause fatigue and should not be taken while at work or driving.  Back pain is very common.  The pain often gets better over time.  The cause of back pain is usually not dangerous.  Most people can learn to manage their back pain on their own.  Home Care Stay active.  Start with short walks on flat ground if you can.  Try to walk farther each day. Do not sit, drive or stand in one place for more than 30 minutes.  Do not stay in bed. Do not avoid exercise or work.  Activity can help your back heal faster. Be careful when you bend or lift an object.  Bend at your knees, keep the object close to you, and do not twist. Sleep on a firm mattress.  Lie on your side, and bend your knees.  If you lie on your back, put a pillow under your knees. Only take medicines as told by your doctor. Put ice on the injured area. Put ice in a plastic bag Place a towel between your skin and the bag Leave the ice on for 15-20 minutes, 3-4 times a day for the first 2-3 days. 210 After that, you can switch between ice and heat packs. Ask your doctor about back exercises or massage. Avoid feeling anxious or stressed.  Find good ways to deal with stress, such as exercise.  Get Help Right Way If: Your pain does not go away with rest or medicine. Your pain does not go away in 1 week. You have new problems. You do not feel well. The pain spreads into your legs. You cannot  control when you poop (bowel movement) or pee (urinate) You feel sick to your stomach (nauseous) or throw up (vomit) You have belly (abdominal) pain. You feel like you may pass out (faint). If you develop a fever.  Make Sure you: Understand these instructions. Will watch your condition Will get help right away if you are not doing well or get worse.  Your e-visit answers were reviewed by a board certified advanced clinical practitioner to complete your personal care plan.  Depending on the condition, your plan could have included both over the counter or prescription medications.  If there is a problem please reply  once you have received a response from your provider.  Your safety is important to us .  If you have drug allergies check your prescription carefully.    You can use MyChart to ask questions about today's visit, request a non-urgent call back, or ask for a work or school excuse for 24 hours related to this e-Visit. If it has been greater than 24 hours you will need to follow up with your provider, or enter a new e-Visit to address those concerns.  You will get an e-mail in the next two days asking about your experience.  I hope that your e-visit has been valuable and  will speed your recovery. Thank you for using e-visits.    have provided 5 minutes of non face to face time during this encounter for chart review and documentation.

## 2023-10-25 ENCOUNTER — Telehealth (HOSPITAL_BASED_OUTPATIENT_CLINIC_OR_DEPARTMENT_OTHER): Payer: Self-pay | Admitting: Pulmonary Disease

## 2023-10-25 MED ORDER — ALBUTEROL SULFATE HFA 108 (90 BASE) MCG/ACT IN AERS: 2.0000 | INHALATION_SPRAY | Freq: Four times a day (QID) | RESPIRATORY_TRACT | 2 refills | Status: AC | PRN

## 2023-10-25 NOTE — Telephone Encounter (Signed)
 [facility] Telephone Encounter  Patient reports wheezing and nonproductive cough similar to prior episodes that occurs annually. Albuterol inhaler has expired. Requesting new rx.  Plan Albuterol ordered If symptoms persist, advise for evaluation

## 2023-11-02 ENCOUNTER — Telehealth: Admitting: Family

## 2023-11-02 DIAGNOSIS — M545 Low back pain, unspecified: Secondary | ICD-10-CM | POA: Diagnosis not present

## 2023-11-02 MED ORDER — BACLOFEN 10 MG PO TABS
10.0000 mg | ORAL_TABLET | Freq: Three times a day (TID) | ORAL | 0 refills | Status: DC
  Filled 2023-11-02: qty 60, 20d supply, fill #0

## 2023-11-02 MED ORDER — NAPROXEN 500 MG PO TABS
500.0000 mg | ORAL_TABLET | Freq: Two times a day (BID) | ORAL | 1 refills | Status: DC
  Filled 2023-11-02: qty 90, 45d supply, fill #0

## 2023-11-02 NOTE — Progress Notes (Signed)
 E-Visit for Back Pain   We are sorry that you are not feeling well.  Here is how we plan to help!  Based on what you have shared with me it looks like you mostly have acute back pain.  Acute back pain is defined as musculoskeletal pain that can resolve in 1-3 weeks with conservative treatment.  I have prescribed Naprosyn 500 mg take one by mouth twice a day non-steroid anti-inflammatory (NSAID) as well as Baclofen 10 mg every eight hours as needed which is a muscle relaxer  Some patients experience stomach irritation or in increased heartburn with anti-inflammatory drugs.  Please keep in mind that muscle relaxer's can cause fatigue and should not be taken while at work or driving.  Back pain is very common.  The pain often gets better over time.  The cause of back pain is usually not dangerous.  Most people can learn to manage their back pain on their own.  Home Care Stay active.  Start with short walks on flat ground if you can.  Try to walk farther each day. Do not sit, drive or stand in one place for more than 30 minutes.  Do not stay in bed. Do not avoid exercise or work.  Activity can help your back heal faster. Be careful when you bend or lift an object.  Bend at your knees, keep the object close to you, and do not twist. Sleep on a firm mattress.  Lie on your side, and bend your knees.  If you lie on your back, put a pillow under your knees. Only take medicines as told by your doctor. Put ice on the injured area. Put ice in a plastic bag Place a towel between your skin and the bag Leave the ice on for 15-20 minutes, 3-4 times a day for the first 2-3 days. 210 After that, you can switch between ice and heat packs. Ask your doctor about back exercises or massage. Avoid feeling anxious or stressed.  Find good ways to deal with stress, such as exercise.  Get Help Right Way If: Your pain does not go away with rest or medicine. Your pain does not go away in 1 week. You have new  problems. You do not feel well. The pain spreads into your legs. You cannot control when you poop (bowel movement) or pee (urinate) You feel sick to your stomach (nauseous) or throw up (vomit) You have belly (abdominal) pain. You feel like you may pass out (faint). If you develop a fever.  Make Sure you: Understand these instructions. Will watch your condition Will get help right away if you are not doing well or get worse.  Your e-visit answers were reviewed by a board certified advanced clinical practitioner to complete your personal care plan.  Depending on the condition, your plan could have included both over the counter or prescription medications.  If there is a problem please reply  once you have received a response from your provider.  Your safety is important to [place].  If you have drug allergies check your prescription carefully.    You can use MyChart to ask questions about today's visit, request a non-urgent call back, or ask for a work or school excuse for 24 hours related to this e-Visit. If it has been greater than 24 hours you will need to follow up with your provider, or enter a new e-Visit to address those concerns.  You will get an e-mail in the next two days asking about  your experience.  I hope that your e-visit has been valuable and will speed your recovery. Thank you for using e-visits.   Approximately 5 minutes was spent documenting and reviewing patient's chart.

## 2023-11-03 ENCOUNTER — Other Ambulatory Visit (HOSPITAL_BASED_OUTPATIENT_CLINIC_OR_DEPARTMENT_OTHER): Payer: Self-pay

## 2023-11-04 ENCOUNTER — Ambulatory Visit (HOSPITAL_BASED_OUTPATIENT_CLINIC_OR_DEPARTMENT_OTHER): Admitting: Student

## 2023-11-04 ENCOUNTER — Encounter (HOSPITAL_BASED_OUTPATIENT_CLINIC_OR_DEPARTMENT_OTHER): Payer: Self-pay | Admitting: Student

## 2023-11-04 ENCOUNTER — Encounter (HOSPITAL_BASED_OUTPATIENT_CLINIC_OR_DEPARTMENT_OTHER): Payer: Self-pay

## 2023-11-04 ENCOUNTER — Ambulatory Visit (HOSPITAL_BASED_OUTPATIENT_CLINIC_OR_DEPARTMENT_OTHER)

## 2023-11-04 DIAGNOSIS — M545 Low back pain, unspecified: Secondary | ICD-10-CM | POA: Diagnosis not present

## 2023-11-04 DIAGNOSIS — M25551 Pain in right hip: Secondary | ICD-10-CM

## 2023-11-04 DIAGNOSIS — M419 Scoliosis, unspecified: Secondary | ICD-10-CM | POA: Diagnosis not present

## 2023-11-04 DIAGNOSIS — M21051 Valgus deformity, not elsewhere classified, right hip: Secondary | ICD-10-CM | POA: Diagnosis not present

## 2023-11-04 DIAGNOSIS — M533 Sacrococcygeal disorders, not elsewhere classified: Secondary | ICD-10-CM | POA: Diagnosis not present

## 2023-11-04 NOTE — Progress Notes (Signed)
 Chief Complaint: Right sacroiliac pain    Discussed the use of AI scribe software for clinical note transcription with the patient, who gave verbal consent to proceed.  History of Present Illness Samantha Hayes is a 48 year old female who presents with right-sided sacroiliac joint pain. She experiences right-sided sacroiliac joint pain radiating to the side of her right hip, which began a month ago after lifting a clothes basket. Prednisone  initially provided relief, but the pain recently recurred after bending over to pick up something off the ground. A previous SI joint injection in May 2024 with [facility] provided significant relief, but a change in insurance necessitates seeking care elsewhere. Naproxen  and baclofen  have not provided significant relief. The pain is localized to the right side of the low back/SI area, radiating to the hip, with no numbness or tingling in the legs. The pain worsens with prolonged sitting.   Surgical History:   None  PMH/PSH/Family History/Social History/Meds/Allergies:    Past Medical History:  Diagnosis Date   Allergy    Anxiety 2009   Cancer [facility]) 2009   cervical   Depression    No pertinent past medical history    Status post surgical removal of both fallopian tubes    Thyroid disease 2016   Past Surgical History:  Procedure Laterality Date   CERVICAL CONIZATION W/BX  04/22/2011   Procedure: CONIZATION CERVIX WITH BIOPSY;  Surgeon: Madelene Schanz, MD;  Location: [facility];  Service: Gynecology;  Laterality: N/A;   CESAREAN SECTION  2005   DIAGNOSTIC LAPAROSCOPY     OVARIAN CYST SURGERY Left    Social History   Socioeconomic History   Marital status: Married    Spouse name: Not on file   Number of children: Not on file   Years of education: Not on file   Highest education level: Not on file  Occupational History   Not on file  Tobacco Use   Smoking status: Never   Smokeless tobacco: Never  Vaping  Use   Vaping status: Never Used  Substance and Sexual Activity   Alcohol use: No   Drug use: No   Sexual activity: Yes    Birth control/protection: Other-see comments    Comment: tubes removed  Other Topics Concern   Not on file  Social History Narrative   Not on file   Social Drivers of Health   Financial Resource Strain: Low Risk  (08/27/2022)   Received from [facility]   Overall Financial Resource Strain (CARDIA)    Difficulty of Paying Living Expenses: Not hard at all  Food Insecurity: No Food Insecurity (08/27/2022)   Received from [facility]   Hunger Vital Sign    Worried About Running Out of Food in the Last Year: Never true    Ran Out of Food in the Last Year: Never true  Transportation Needs: No Transportation Needs (08/27/2022)   Received from [facility] - Transportation    Lack of Transportation (Medical): No    Lack of Transportation (Non-Medical): No  Physical Activity: Insufficiently Active (08/27/2022)   Received from [facility]   Exercise Vital Sign    Days of Exercise per Week: 5 days    Minutes of Exercise per Session: 20 min  Stress: No Stress Concern Present (04/04/2023)   Received  from [facility] - Occupational Stress Questionnaire    Feeling of Stress : Not at all  Social Connections: Socially Integrated (08/27/2022)   Received from [facility]   Social Network    How would you rate your social network (family, work, friends)?: Good participation with social networks   Family History  Problem Relation Age of Onset   Asthma Mother    Depression Mother    Diabetes Mother    Vision loss Maternal Grandmother    Kidney disease Maternal Grandfather    No Known Allergies Current Outpatient Medications  Medication Sig Dispense Refill   albuterol  (VENTOLIN  HFA) 108 (90 Base) MCG/ACT inhaler Inhale 2 puffs into the lungs every 6 (six) hours as needed for wheezing or shortness of breath. 8  g 2   baclofen  (LIORESAL ) 10 MG tablet Take 1 tablet (10 mg total) by mouth 3 (three) times daily. 60 each 0   cyclobenzaprine  (FLEXERIL ) 5 MG tablet Take 1 tablet (5 mg total) by mouth 3 (three) times daily as needed for muscle spasms. 30 tablet 2   diclofenac  (VOLTAREN ) 75 MG EC tablet Take 1 tablet (75 mg total) by mouth 2 (two) times daily. 30 tablet 0   fluticasone  (FLONASE ) 50 MCG/ACT nasal spray Place 2 sprays into both nostrils daily. 16 g 0   levothyroxine  (SYNTHROID , LEVOTHROID) 75 MCG tablet TAKE 1 TABLET BY MOUTH ONCE DAILY 90 tablet 3   Multiple Vitamin (MULTIVITAMIN) tablet Take 1 tablet by mouth daily.     naproxen  (NAPROSYN ) 500 MG tablet Take 1 tablet (500 mg total) by mouth 2 (two) times daily with a meal. 90 tablet 1   oseltamivir  (TAMIFLU ) 75 MG capsule Take 1 capsule (75 mg total) by mouth 2 (two) times daily. 10 capsule 0   predniSONE  (STERAPRED UNI-PAK 21 TAB) 10 MG (21) TBPK tablet Use as directed 21 tablet 0   No current facility-administered medications for this visit.   No results found.  Review of Systems:   A ROS was performed including pertinent positives and negatives as documented in the HPI.  Physical Exam :   Constitutional: NAD and appears stated age Neurological: Alert and oriented Psych: Appropriate affect and cooperative Last menstrual period 01/23/2012.   Comprehensive Musculoskeletal Exam:    Tenderness with palpation directly overlying the right SI joint, without any midline lumbar or left SI joint tenderness.  Patient demonstrates normal gait with full range of motion in the lumbar spine and bilateral hips.  Imaging:   Xray (lumbar spine 4 views, AP pelvis, right hip 3 views): Lumbar radiographs demonstrate normal lordotic alignment with well-preserved intervertebral disc spacing.  Some mild lower facet arthropathy at the L4-L5 and L5-S1 levels.  Bilateral hip joint spacing is well-preserved.  Mild irregularity of the right SI joint compared to  contralateral side.   I personally reviewed and interpreted the radiographs.      Assessment & Plan Sacroiliac joint pain   Patient is experiencing symptoms consistent with right-sided sacroiliac joint pain.  She does have a history of this from last year and received an SI joint injection, which she states provided significant relief.  Current flareup likely related to recent physical activity.  Will plan to refer her to Dr. Shauna Del for a potential right sided SI joint injection.  In the meantime she can trial HEP exercises for SI joint pain, resources provided today.      I personally saw and evaluated the patient, and  participated in the management and treatment plan.  Sharrell Deck, PA-C Orthopedics

## 2023-11-05 ENCOUNTER — Other Ambulatory Visit (HOSPITAL_BASED_OUTPATIENT_CLINIC_OR_DEPARTMENT_OTHER): Payer: Self-pay

## 2023-11-05 ENCOUNTER — Other Ambulatory Visit (HOSPITAL_BASED_OUTPATIENT_CLINIC_OR_DEPARTMENT_OTHER): Payer: Self-pay | Admitting: Student

## 2023-11-05 MED ORDER — METHYLPREDNISOLONE 4 MG PO TBPK
ORAL_TABLET | ORAL | 0 refills | Status: DC
  Filled 2023-11-05: qty 21, 6d supply, fill #0

## 2023-11-18 ENCOUNTER — Other Ambulatory Visit: Payer: Self-pay

## 2023-11-18 ENCOUNTER — Encounter: Payer: Self-pay | Admitting: Sports Medicine

## 2023-11-18 ENCOUNTER — Ambulatory Visit: Admitting: Sports Medicine

## 2023-11-18 DIAGNOSIS — S39012D Strain of muscle, fascia and tendon of lower back, subsequent encounter: Secondary | ICD-10-CM | POA: Diagnosis not present

## 2023-11-18 DIAGNOSIS — M7918 Myalgia, other site: Secondary | ICD-10-CM

## 2023-11-18 DIAGNOSIS — M533 Sacrococcygeal disorders, not elsewhere classified: Secondary | ICD-10-CM

## 2023-11-18 NOTE — Progress Notes (Signed)
 Office Visit Note   Patient: Samantha Hayes           Date of Birth: [birth year]           MRN: [id number] Visit Date: 11/18/2023              Requested by: Dettinger, Lucio Sabin, MD [address] [place],  [place] [address] PCP: Dettinger, Lucio Sabin, MD  Medical Resident, Sports Medicine Fellow - Attending Physician Addendum:   I have independently interviewed and examined the patient myself. I have discussed the above with the original author and agree with their documentation. My edits for correction/addition/clarification have been made, see any changes above and below.   In summary, very pleasant 48 year old female with acute on chronic right SI joint dysfunction.  She has early sclerotic changes in the SI joint but also has an insufficiency of the surrounding SI and pelvic ligament stabilizers with previous lumbar strains. Through shared decision making, did proceed with right SI joint injection under ultrasound guidance for pain relief, patient tolerated well.  We did give her a handout to perform home exercises and will add a referral to PT to work on the SI joint complex, pelvic stability and low back strengthening.  Okay for baclofen  or over-the-counter anti-inflammatories as needed. F/u PRN.  Shauna Del, DO Primary Care Sports Medicine Physician  [facility] - Orthopedics   Assessment & Plan: Visit Diagnoses:  1. Sacroiliac joint dysfunction of right side   2. Right buttock pain   3. Strain of lumbar paraspinous muscle, subsequent encounter    Plan: Discussed with patient that her pain is likely coming from the SI joint at this time.  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  Discussed with patient that strengthening the lumbar paraspinal muscles would be beneficial for her overall.  Discussed with patient on how to properly hip pain she and forward  bend.  Will also have patient's start PT which she already has an appointment for tomorrow for her ankle, we will go ahead and also added some SI joint, pelvic stability, and low back rehab exercises and also gave her some to do at home which she can start in about 2 to 3 days.  Patient like to go ahead with injection at this time, patient tolerated procedure well and noted improvement after.  Patient to follow-up as needed  Follow-Up Instructions: Return if symptoms worsen or fail to improve.   Orders:  Orders Placed This Encounter  Procedures   US  Guided Needle Placement - No Linked Charges   Ambulatory referral to Physical Therapy   No orders of the defined types were placed in this encounter.     Procedures: U/S-guided SI-joint injection, right   After discussion of risk/benefits/indications, informed verbal consent was obtained. A timeout was then performed. The patient was positioned in a prone position on exam room table with a pillow placed under the pelvis for mild hip flexion. The SI joint area was cleaned and prepped with betadine and alcohol swabs. Sterile ultrasound gel was applied and the ultrasound transducer was placed in an anatomic axial plane over the PSIS, then moved distally over the SI-joint. Using ultrasound guidance, a 22-gauge, 3.5 needle was inserted from a medial to lateral approach utilizing an in-plane approach and directed into the SI-joint. The SI-joint was then injected with  a mixture of 4:2 lidocaine :depomedrol with visualization of the injectate flow into the SI-joint under ultrasound visualization. The patient tolerated the procedure well without immediate complications.    Clinical Data: No additional findings.   Subjective: Chief Complaint  Patient presents with   Lower Back - Pain    The patient presents with recurrent right sacroiliac (SI) joint pain, for which she has previously received an injection with good relief. She reports that the pain  recurred around May 1st of this year, describing a sensation of it catching in the joint. Since then, she has experienced persistent discomfort localized to the right SI joint, occasionally radiating anteriorly to the front of the hip. She denies any associated lower extremity numbness, tingling, or true radicular symptoms. She has completed two courses of oral prednisone , both of which provided temporary relief; however, symptoms have returned since completing the most recent course. She also reports some benefit from heat therapy. Her symptoms are aggravated by transitional movements, particularly rising from a seated to a standing position. 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 She presents today to discuss the possibility of another SI joint injection.      Review of Systems   Objective: Vital Signs: LMP 01/23/2012   Physical Exam  Ortho Exam Inspection reveals no gross abnormalities of the lumbar spine.  There is tenderness to palpation over the lumbar paraspinal muscles and increased tenderness to palpation over the right SI joint.  There is some decreased range of motion with flexion and extension of the spine.  Veldon German is negative.  FADIR negative.  Strength is 5 out of 5 with hip abduction, flexion and knee extension Specialty Comments:  No specialty comments available.  Imaging: No results found.   PMFS History: Patient Active Problem List   Diagnosis Date Noted   Depression, major, single episode, mild (HCC) 03/14/2017   Hypothyroidism 01/27/2017   h/o CIS (carcinoma in situ of cervix)/CIN 3 s/p CKC 04/2011 02/11/2012   Past Medical History:  Diagnosis Date   Allergy    Anxiety 2009   Cancer [facility]) 2009   cervical   Depression    No pertinent past medical history    Status post surgical removal of both fallopian tubes    Thyroid disease 2016    Family History  Problem  Relation Age of Onset   Asthma Mother    Depression Mother    Diabetes Mother    Vision loss Maternal Grandmother    Kidney disease Maternal Grandfather     Past Surgical History:  Procedure Laterality Date   CERVICAL CONIZATION W/BX  04/22/2011   Procedure: CONIZATION CERVIX WITH BIOPSY;  Surgeon: Madelene Schanz, MD;  Location: [facility];  Service: Gynecology;  Laterality: N/A;   CESAREAN SECTION  2005   DIAGNOSTIC LAPAROSCOPY     OVARIAN CYST SURGERY Left    Social History   Occupational History   Not on file  Tobacco Use   Smoking status: Never   Smokeless tobacco: Never  Vaping Use   Vaping status: Never Used  Substance and Sexual Activity   Alcohol use: No   Drug use: No   Sexual activity: Yes    Birth control/protection: Other-see comments    Comment: tubes removed

## 2023-11-18 NOTE — Progress Notes (Signed)
 Patient has had right SI joint pain that she has had injection for in the past. She did get good relief with that injection. She says that around May 1st this year, she felt it catch again and has had pain since then. She denies any pain, numbness, or tingling in the legs, and says that if she feels it anywhere besides the SI joint, it is wrapping around to the front of her hip. She has done 2 courses of oral Prednisone  that have helped, although she finished the second before the weekend and it is wearing off again. She has also used heat. 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 She is here for possible SI joint injection today.

## 2023-11-19 ENCOUNTER — Other Ambulatory Visit: Payer: Self-pay

## 2023-11-19 ENCOUNTER — Encounter (HOSPITAL_BASED_OUTPATIENT_CLINIC_OR_DEPARTMENT_OTHER): Payer: Self-pay | Admitting: Physical Therapy

## 2023-11-19 ENCOUNTER — Ambulatory Visit (HOSPITAL_BASED_OUTPATIENT_CLINIC_OR_DEPARTMENT_OTHER): Attending: Orthopedic Surgery | Admitting: Physical Therapy

## 2023-11-19 DIAGNOSIS — M62830 Muscle spasm of back: Secondary | ICD-10-CM | POA: Insufficient documentation

## 2023-11-19 DIAGNOSIS — M5459 Other low back pain: Secondary | ICD-10-CM | POA: Insufficient documentation

## 2023-11-19 NOTE — Therapy (Signed)
 OUTPATIENT PHYSICAL THERAPY THORACOLUMBAR EVALUATION   Patient Name: Samantha Hayes MRN: [id number] DOB:[birth year], 48 y.o., female Today's Date: 11/20/2023  END OF SESSION:  PT End of Session - 11/19/23 1702     Visit Number 1    Number of Visits 16    Date for PT Re-Evaluation 01/14/24    PT Start Time 1645    PT Stop Time 1735    PT Time Calculation (min) 50 min    Activity Tolerance Patient tolerated treatment well    Behavior During Therapy [facility] for tasks assessed/performed          Past Medical History:  Diagnosis Date   Allergy    Anxiety 2009   Cancer [facility]) 2009   cervical   Depression    No pertinent past medical history    Status post surgical removal of both fallopian tubes    Thyroid disease 2016   Past Surgical History:  Procedure Laterality Date   CERVICAL CONIZATION W/BX  04/22/2011   Procedure: CONIZATION CERVIX WITH BIOPSY;  Surgeon: Madelene Schanz, MD;  Location: [facility];  Service: Gynecology;  Laterality: N/A;   CESAREAN SECTION  2005   DIAGNOSTIC LAPAROSCOPY     OVARIAN CYST SURGERY Left    Patient Active Problem List   Diagnosis Date Noted   Depression, major, single episode, mild (HCC) 03/14/2017   Hypothyroidism 01/27/2017   h/o CIS (carcinoma in situ of cervix)/CIN 3 s/p CKC 04/2011 02/11/2012    PCP: Jerrilyn Moras Dettinger MD   REFERRING PROVIDER: Amada Backer MD   REFERRING DIAG:  Diagnosis  M53.3 (ICD-10-CM) - Sacroiliac joint dysfunction of right side  M79.18 (ICD-10-CM) - Right buttock pain  S39.012D (ICD-10-CM) - Strain of lumbar paraspinous muscle, subsequent encounter    Rationale for Evaluation and Treatment: Rehabilitation  THERAPY DIAG:  No diagnosis found.  ONSET DATE: acute onset in May Has a past history   SUBJECTIVE:                                                                                                                                                                                           SUBJECTIVE  STATEMENT: Patient has a history of intermittent low back pain.  Starting about a month ago she was reaching down to drill and a screw and felt a pull in her back.  The pain improved for a short time then she had a reexacerbation of pain.  The pain is located on the right side of her low back it radiates into her right hip.  She has increased pain when she sits for period of time.  She also has  pain when she first stands.  When she stands and starts moving the pain tends to decrease.  She had an SI injection yesterday.  She feels no significant improvement yet but still early.  PERTINENT HISTORY:  4 child births,   PAIN:  Are you having pain? Yes: NPRS scale: 4/10 right now 6/10 in the past week  Pain location: Sacral area but wraps around to the front of her hip  Pain description: aching  Aggravating factors: sitting for too long and when she initially stands  Relieving factors: when she moves around she feels better  PRECAUTIONS: None  RED FLAGS: None   WEIGHT BEARING RESTRICTIONS: No  FALLS:  Has patient fallen in last 6 months? No  LIVING ENVIRONMENT: 4 steps into the house.  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  DIAGNOSTIC FINDINGS:  X-ray:  1. Mild broad-based dextroscoliotic curvature. 2. Minor anterior spurring at L2-L3 and L3-L4.  PATIENT SURVEYS:  Modified Oswestry 5/50   COGNITION: Overall cognitive status: Within functional limits for tasks assessed     SENSATION: Denies paresthesias   MUSCLE LENGTH:   POSTURE: No Significant postural limitations  PALPATION:   LUMBAR ROM:   AROM eval  Flexion   Extension   Right lateral flexion   Left lateral flexion   Right rotation No pain   Left rotation Painful on the right    (Blank rows = not tested)  LOWER  EXTREMITY ROM:     Passive  Right eval Left eval  Hip flexion 20.7 30.7  Hip extension    Hip abduction 22.9 24.3  Hip adduction    Hip internal rotation    Hip external rotation    Knee flexion    Knee extension 51.4 62.1  Ankle dorsiflexion    Ankle plantarflexion    Ankle inversion    Ankle eversion     (Blank rows = not tested)  LOWER EXTREMITY MMT:    MMT Right eval Left eval  Hip flexion    Hip extension    Hip abduction    Hip adduction    Hip internal rotation    Hip external rotation    Knee flexion    Knee extension    Ankle dorsiflexion    Ankle plantarflexion    Ankle inversion    Ankle eversion     (Blank rows = not tested)   GAIT: Mild limitation in hip rotation in standing  TREATMENT DATE:  Manual:  Trigger point release to lumbar spine and upper gluteal  Education on her trigger points  LAD grade II and III with oscillations  Review of self soft tissue mobilization with a ball   Improved passive hip flexion noted   There-ex: Gluteal stretch 3x20 sec hold   Sink stretch fwd and lateral  Review of static prone on elbows                     Neuro-re-ed Bridge x10 with cuing for breathing and muscle activation   Hip abduction with band 2x10 green with cuing for resistance and muscle grading  PATIENT EDUCATION:  Education details: HEP, symptom management, progression of exercises, RPE, anatomy of T Person educated: Patient Education method: Explanation, Demonstration, Tactile cues, Verbal cues, and Handouts Education comprehension: verbalized understanding, returned demonstration, verbal cues required, tactile cues required, and needs further education  HOME EXERCISE PROGRAM: Access Code: [email or web address] URL: [email or web address] Date: 11/20/2023 Prepared by: Signa Drier  Exercises - Static Prone on Elbows  - 1 x  daily - 7 x weekly - 3 sets - 10 reps - Standing 'L' Stretch at Counter  - 1 x daily - 7 x weekly - 3 sets - 10 reps - Supine Piriformis Stretch with Foot on Ground  - 1 x daily - 7 x weekly - 3 sets - 3 reps - 20 sec  hold - Standing Glute Med Mobilization with Small Ball on Wall  - 1 x daily - 7 x weekly - 3 sets - 10 reps - Supine Bridge with Resistance Band  - 1 x daily - 7 x weekly - 3 sets - 10 reps - Hooklying Clamshell with Resistance  - 1 x daily - 7 x weekly - 3 sets - 10 reps  ASSESSMENT:  CLINICAL IMPRESSION: The patient is a 48 year old female presents with acute onset of right sided low back pain starting greater than 1 month prior.  She has significant spasming in her right lumbar paraspinals and into her QL.  She has increased pain with sitting.  She reports improved pain when she extends.  She reports at work she tends to extend if she feels like she is getting stiff.  She has decree strength on the right side compared to the left.  She would benefit from skilled therapy to reduce spasming in lower back and improve core stability and strength.  Therapy for manual therapy today which improved her pain free hip flexion. OBJECTIVE IMPAIRMENTS: decreased activity tolerance, decreased ROM, decreased strength, increased muscle spasms, impaired vision/preception, and pain.   ACTIVITY LIMITATIONS: carrying, bending, sitting, standing, and locomotion level  PARTICIPATION LIMITATIONS: driving, community activity, and occupation  PERSONAL FACTORS: 1 comorbidity: birth x4, recent foot surgery   are also affecting patient's functional outcome.   REHAB POTENTIAL: Good  CLINICAL DECISION MAKING: Stable/uncomplicated  EVALUATION COMPLEXITY: Low   GOALS: Goals reviewed with patient? Yes  SHORT TERM GOALS: Target date: 12/18/2023    Patient will increase gross bilateral LE strength by 5 lbs  Baseline: Goal status: INITIAL  2.  Patient will rotate L/S to the left without pain   Baseline:  Goal status: INITIAL  3.  Patient will increase pain free right hip flexion to 110 degrees  Baseline:  Goal status: INITIAL   LONG TERM GOALS: Target date: 01/15/2024    Patient will sit at work without pain  Baseline:  Goal status: INITIAL  2.  Patient will have a full home core stability program  Baseline:  Goal status: INITIAL  3.  Patient will report no pain when transferring from sit to stand after sitting for a period of time.  Baseline:  Goal status: INITIAL   PLAN:  PT FREQUENCY: 1-2x/week  PT DURATION: 8 weeks  PLANNED INTERVENTIONS: 97110-Therapeutic exercises, 97530- Therapeutic activity, [id number]- Neuromuscular re-education, 97535- Self Care, 16109- Manual therapy, [id number]- Gait training, [phone]- Aquatic Therapy, 97014- Electrical stimulation (unattended), [phone]- Ultrasound, Patient/Family education, Stair training, Taping, Dry Needling, DME instructions, Cryotherapy, and Moist heat .  PLAN FOR NEXT SESSION: Consider trigger point dry needling.  Continue with soft tissue mobilization of the lumbar spine.  Continue  with LAD if found to be  beneficial.  Progress core strength.  Consider standing exercises as tolerated.  Review tolerance to prone activity.  If prone exercises are improving pain consider prone press ups.  Review HEP.   Kitty Perkins, PT 11/20/2023, 7:52 AM

## 2023-11-20 ENCOUNTER — Encounter (HOSPITAL_BASED_OUTPATIENT_CLINIC_OR_DEPARTMENT_OTHER): Payer: Self-pay | Admitting: Physical Therapy

## 2023-11-20 NOTE — Addendum Note (Signed)
 Addended by: Kitty Perkins on: 11/20/2023 01:42 PM   Modules accepted: Orders

## 2023-12-10 ENCOUNTER — Ambulatory Visit (HOSPITAL_BASED_OUTPATIENT_CLINIC_OR_DEPARTMENT_OTHER): Payer: Self-pay | Attending: Orthopedic Surgery | Admitting: Physical Therapy

## 2023-12-10 DIAGNOSIS — M62830 Muscle spasm of back: Secondary | ICD-10-CM | POA: Insufficient documentation

## 2023-12-10 DIAGNOSIS — M5459 Other low back pain: Secondary | ICD-10-CM | POA: Insufficient documentation

## 2023-12-30 ENCOUNTER — Ambulatory Visit (HOSPITAL_BASED_OUTPATIENT_CLINIC_OR_DEPARTMENT_OTHER): Admitting: Physical Therapy

## 2023-12-31 ENCOUNTER — Encounter (HOSPITAL_BASED_OUTPATIENT_CLINIC_OR_DEPARTMENT_OTHER): Payer: Self-pay | Admitting: Physical Therapy

## 2024-01-06 ENCOUNTER — Encounter (HOSPITAL_BASED_OUTPATIENT_CLINIC_OR_DEPARTMENT_OTHER): Admitting: Physical Therapy

## 2024-01-15 ENCOUNTER — Ambulatory Visit (INDEPENDENT_AMBULATORY_CARE_PROVIDER_SITE_OTHER): Admitting: Family Medicine

## 2024-01-15 ENCOUNTER — Other Ambulatory Visit (HOSPITAL_BASED_OUTPATIENT_CLINIC_OR_DEPARTMENT_OTHER): Payer: Self-pay

## 2024-01-15 VITALS — BP 118/76 | HR 69 | Temp 97.6°F | Ht 66.0 in | Wt 206.0 lb

## 2024-01-15 DIAGNOSIS — E039 Hypothyroidism, unspecified: Secondary | ICD-10-CM | POA: Diagnosis not present

## 2024-01-15 DIAGNOSIS — L709 Acne, unspecified: Secondary | ICD-10-CM | POA: Insufficient documentation

## 2024-01-15 DIAGNOSIS — Z1231 Encounter for screening mammogram for malignant neoplasm of breast: Secondary | ICD-10-CM | POA: Diagnosis not present

## 2024-01-15 DIAGNOSIS — Z0000 Encounter for general adult medical examination without abnormal findings: Secondary | ICD-10-CM | POA: Insufficient documentation

## 2024-01-15 MED ORDER — DESVENLAFAXINE SUCCINATE ER 50 MG PO TB24
50.0000 mg | ORAL_TABLET | Freq: Every day | ORAL | 1 refills | Status: AC
  Filled 2024-01-15: qty 90, 90d supply, fill #0
  Filled 2024-06-09: qty 90, 90d supply, fill #1

## 2024-01-15 MED ORDER — CLINDAMYCIN PHOSPHATE 1 % EX SWAB
1.0000 | Freq: Every day | CUTANEOUS | 2 refills | Status: AC
  Filled 2024-01-15: qty 120, 60d supply, fill #0
  Filled 2024-04-01: qty 120, 60d supply, fill #1

## 2024-01-15 NOTE — Assessment & Plan Note (Signed)

## 2024-01-15 NOTE — Assessment & Plan Note (Signed)
 Refill of clindamycin provided today.

## 2024-01-15 NOTE — Assessment & Plan Note (Signed)
 We can proceed with monitoring of TSH today.  She is clinically euthyroid and reports that prior labs have been stable.  We can plan to monitor intermittently, sooner follow-up if having any concerning symptoms Not currently following with endocrinology, consider new referral pending progress of symptoms and management

## 2024-01-15 NOTE — Progress Notes (Signed)
 New Patient Office Visit  Subjective   Patient ID: Samantha Hayes, female    DOB: 1976/05/03  Age: 48 y.o. MRN: [id number]  CC:  Chief Complaint  Patient presents with   Establish Care    Patient presents today to establish care. She needs a refill of Prestiq.     HPI SHAMAINE MULKERN presents to establish care  Was following with various [facility] providers, however due to insurance, she has been transitioning to in network providers.  Has had prior colonoscopy, recommended follow-up was 7 years.  Last dental appointment about 8 months ago.  Reports that her prior OB/GYN was managing vasomotor symptoms with desvenlafaxine, reports satisfactory control, requesting refill today.  She also reports issues with acne, was being prescribed tretinoin and clindamycin through dermatology.  Reports that tretinoin has been expensive and she is also noted more skin irritation and dryness with this.  Utilizes clindamycin daily, requesting refill, reports that clindamycin swab is preferred from a cost perspective.  Thyroid disease: has had issues with both hyper and hypothyroidism, not currently taking medication, reports recent TSH has been normal  Patient is originally from [facility], lives with her currently.  She works for [employer].  Outside of work, she enjoys spending time with her family, youngest child is involved in sports, they do show chickens.  Outpatient Encounter Medications as of 01/15/2024  Medication Sig   albuterol (VENTOLIN HFA) 108 (90 Base) MCG/ACT inhaler Inhale 2 puffs into the lungs every 6 (six) hours as needed for wheezing or shortness of breath.   clindamycin (CLEOCIN T) 1 % SWAB Apply 1 application  topically daily.   desvenlafaxine (PRISTIQ) 50 MG 24 hr tablet Take 1 tablet (50 mg total) by mouth daily.   methylPREDNISolone (MEDROL DOSEPAK) 4 MG TBPK tablet Take per packet instructions   Multiple Vitamin (MULTIVITAMIN) tablet Take 1 tablet by mouth daily.    naproxen (NAPROSYN) 500 MG tablet Take 1 tablet (500 mg total) by mouth 2 (two) times daily with a meal.   baclofen (LIORESAL) 10 MG tablet Take 1 tablet (10 mg total) by mouth 3 (three) times daily. (Patient not taking: Reported on 01/15/2024)   fluticasone (FLONASE) 50 MCG/ACT nasal spray Place 2 sprays into both nostrils daily. (Patient not taking: Reported on 01/15/2024)   [DISCONTINUED] cyclobenzaprine (FLEXERIL) 5 MG tablet Take 1 tablet (5 mg total) by mouth 3 (three) times daily as needed for muscle spasms.   [DISCONTINUED] diclofenac (VOLTAREN) 75 MG EC tablet Take 1 tablet (75 mg total) by mouth 2 (two) times daily.   [DISCONTINUED] levothyroxine (SYNTHROID, LEVOTHROID) 75 MCG tablet TAKE 1 TABLET BY MOUTH ONCE DAILY   [DISCONTINUED] oseltamivir (TAMIFLU) 75 MG capsule Take 1 capsule (75 mg total) by mouth 2 (two) times daily.   No facility-administered encounter medications on file as of 01/15/2024.    Past Medical History:  Diagnosis Date   Allergy    Anxiety 2009   Cancer [facility]) 2009   cervical   Depression    No pertinent past medical history    Status post surgical removal of both fallopian tubes    Thyroid disease 2016    Past Surgical History:  Procedure Laterality Date   CERVICAL CONIZATION W/BX  04/22/2011   Procedure: CONIZATION CERVIX WITH BIOPSY;  Surgeon: Jon CINDERELLA Rummer, MD;  Location: [facility];  Service: Gynecology;  Laterality: N/A;   CESAREAN SECTION  2005   DIAGNOSTIC LAPAROSCOPY     OVARIAN CYST SURGERY Left  Family History  Problem Relation Age of Onset   Asthma Mother    Depression Mother    Diabetes Mother    Vision loss Maternal Grandmother    Kidney disease Maternal Grandfather     Social History   Socioeconomic History   Marital status: Married    Spouse name: Not on file   Number of children: Not on file   Years of education: Not on file   Highest education level: Not on file  Occupational History   Not on file  Tobacco Use    Smoking status: Never   Smokeless tobacco: Never  Vaping Use   Vaping status: Never Used  Substance and Sexual Activity   Alcohol use: No   Drug use: No   Sexual activity: Yes    Birth control/protection: Other-see comments    Comment: tubes removed  Other Topics Concern   Not on file  Social History Narrative   Not on file   Social Drivers of Health   Financial Resource Strain: Low Risk  (08/27/2022)   Received from [facility]   Overall Financial Resource Strain (CARDIA)    Difficulty of Paying Living Expenses: Not hard at all  Food Insecurity: No Food Insecurity (08/27/2022)   Received from [facility]   Hunger Vital Sign    Within the past 12 months, you worried that your food would run out before you got the money to buy more.: Never true    Within the past 12 months, the food you bought just didn't last and you didn't have money to get more.: Never true  Transportation Needs: No Transportation Needs (08/27/2022)   Received from [facility] - Transportation    Lack of Transportation (Medical): No    Lack of Transportation (Non-Medical): No  Physical Activity: Insufficiently Active (08/27/2022)   Received from [facility]   Exercise Vital Sign    On average, how many days per week do you engage in moderate to strenuous exercise (like a brisk walk)?: 5 days    On average, how many minutes do you engage in exercise at this level?: 20 min  Stress: No Stress Concern Present (04/04/2023)   Received from [facility] - Occupational Stress Questionnaire    Feeling of Stress : Not at all  Social Connections: Socially Integrated (08/27/2022)   Received from [facility]   Social Network    How would you rate your social network (family, work, friends)?: Good participation with social networks  Intimate Partner Violence: Not At Risk (08/27/2022)   Received from [facility]   HITS    Over the last 12 months how often  did your partner physically hurt you?: Never    Over the last 12 months how often did your partner insult you or talk down to you?: Never    Over the last 12 months how often did your partner threaten you with physical harm?: Never    Over the last 12 months how often did your partner scream or curse at you?: Never    Objective   BP 118/76 (BP Location: Right Arm, Patient Position: Sitting, Cuff Size: Normal)   Pulse 69   Temp 97.6 F (36.4 C) (Oral)   Ht 5' 6 (1.676 m)   Wt 206 lb (93.4 kg)   LMP 01/23/2012   BMI 33.25 kg/m   Physical Exam Constitutional:      General: She is not in acute distress.  Appearance: Normal appearance.  HENT:     Head: Normocephalic and atraumatic.     Right Ear: External ear normal.     Left Ear: External ear normal.     Nose: Nose normal. No rhinorrhea.     Mouth/Throat:     Mouth: Mucous membranes are moist.     Pharynx: Oropharynx is clear.  Eyes:     Extraocular Movements: Extraocular movements intact.     Conjunctiva/sclera: Conjunctivae normal.     Pupils: Pupils are equal, round, and reactive to light.  Cardiovascular:     Rate and Rhythm: Normal rate and regular rhythm.     Pulses: Normal pulses.     Heart sounds: Normal heart sounds. No murmur heard. Pulmonary:     Effort: Pulmonary effort is normal. No respiratory distress.     Breath sounds: Normal breath sounds.  Abdominal:     General: Bowel sounds are normal. There is no distension.     Palpations: Abdomen is soft. There is no mass.     Tenderness: There is no abdominal tenderness. There is no guarding.  Musculoskeletal:     Cervical back: Normal range of motion and neck supple.  Skin:    General: Skin is warm and dry.  Neurological:     General: No focal deficit present.     Mental Status: She is alert and oriented to person, place, and time.  Psychiatric:        Mood and Affect: Mood normal.        Thought Content: Thought content normal.     Assessment & Plan:    Wellness examination Assessment & Plan: Routine HCM labs ordered. HCM reviewed/discussed. Anticipatory guidance regarding healthy weight, lifestyle and choices given. Recommend healthy diet.  Recommend approximately 150 minutes/week of moderate intensity exercise Recommend regular dental and vision exams Always use seatbelt/lap and shoulder restraints Recommend using smoke alarms and checking batteries at least twice a year Recommend using sunscreen when outside Discussed immunization recommendations  Orders: -     CBC with Differential/Platelet; Future -     Comprehensive metabolic panel with GFR; Future -     Hemoglobin A1c; Future -     Lipid panel; Future -     TSH Rfx on Abnormal to Free T4; Future  Acquired hypothyroidism Assessment & Plan: We can proceed with monitoring of TSH today.  She is clinically euthyroid and reports that prior labs have been stable.  We can plan to monitor intermittently, sooner follow-up if having any concerning symptoms Not currently following with endocrinology, consider new referral pending progress of symptoms and management   Acne, unspecified acne type Assessment & Plan: Refill of clindamycin provided today.   Encounter for screening mammogram for malignant neoplasm of breast -     3D Screening Mammogram, Left and Right; Future  Other orders -     Desvenlafaxine Succinate ER; Take 1 tablet (50 mg total) by mouth daily.  Dispense: 90 tablet; Refill: 1 -     Clindamycin Phosphate; Apply 1 application  topically daily.  Dispense: 120 each; Refill: 2  Return in about 1 year (around 01/14/2025) for CPE.    ___________________________________________ Giuliano Preece de [place], MD, ABFM, CAQSM Primary Care and Sports Medicine [facility]

## 2024-01-16 ENCOUNTER — Other Ambulatory Visit (HOSPITAL_BASED_OUTPATIENT_CLINIC_OR_DEPARTMENT_OTHER): Payer: Self-pay | Admitting: *Deleted

## 2024-01-16 ENCOUNTER — Other Ambulatory Visit: Payer: Self-pay | Admitting: Medical Genetics

## 2024-01-16 ENCOUNTER — Other Ambulatory Visit (HOSPITAL_BASED_OUTPATIENT_CLINIC_OR_DEPARTMENT_OTHER): Payer: Self-pay

## 2024-01-16 DIAGNOSIS — Z0000 Encounter for general adult medical examination without abnormal findings: Secondary | ICD-10-CM

## 2024-01-17 LAB — COMPREHENSIVE METABOLIC PANEL WITH GFR
ALT: 20 IU/L (ref 0–32)
AST: 21 IU/L (ref 0–40)
Albumin: 4.5 g/dL (ref 3.9–4.9)
Alkaline Phosphatase: 69 IU/L (ref 44–121)
BUN/Creatinine Ratio: 17 (ref 9–23)
BUN: 14 mg/dL (ref 6–24)
Bilirubin Total: 0.6 mg/dL (ref 0.0–1.2)
CO2: 22 mmol/L (ref 20–29)
Calcium: 9.3 mg/dL (ref 8.7–10.2)
Chloride: 103 mmol/L (ref 96–106)
Creatinine, Ser: 0.81 mg/dL (ref 0.57–1.00)
Globulin, Total: 2.5 g/dL (ref 1.5–4.5)
Glucose: 77 mg/dL (ref 70–99)
Potassium: 4.2 mmol/L (ref 3.5–5.2)
Sodium: 141 mmol/L (ref 134–144)
Total Protein: 7 g/dL (ref 6.0–8.5)
eGFR: 91 mL/min/1.73 (ref >59)

## 2024-01-17 LAB — TSH RFX ON ABNORMAL TO FREE T4: TSH: 1.61 u[IU]/mL (ref 0.450–4.500)

## 2024-01-17 LAB — CBC WITH DIFFERENTIAL/PLATELET
Basophils Absolute: 0 x10E3/uL (ref 0.0–0.2)
Basos: 0 %
EOS (ABSOLUTE): 0.3 x10E3/uL (ref 0.0–0.4)
Eos: 4 %
Hematocrit: 41.8 % (ref 34.0–46.6)
Hemoglobin: 13.2 g/dL (ref 11.1–15.9)
Immature Grans (Abs): 0 x10E3/uL (ref 0.0–0.1)
Immature Granulocytes: 0 %
Lymphocytes Absolute: 2.4 x10E3/uL (ref 0.7–3.1)
Lymphs: 40 %
MCH: 30.4 pg (ref 26.6–33.0)
MCHC: 31.6 g/dL (ref 31.5–35.7)
MCV: 96 fL (ref 79–97)
Monocytes Absolute: 0.3 x10E3/uL (ref 0.1–0.9)
Monocytes: 6 %
Neutrophils Absolute: 3 x10E3/uL (ref 1.4–7.0)
Neutrophils: 50 %
Platelets: 216 x10E3/uL (ref 150–450)
RBC: 4.34 x10E6/uL (ref 3.77–5.28)
RDW: 12.8 % (ref 11.7–15.4)
WBC: 6 x10E3/uL (ref 3.4–10.8)

## 2024-01-17 LAB — HEMOGLOBIN A1C
Est. average glucose Bld gHb Est-mCnc: 103 mg/dL
Hgb A1c MFr Bld: 5.2 % (ref 4.8–5.6)

## 2024-01-17 LAB — LIPID PANEL
Chol/HDL Ratio: 3.2 ratio (ref 0.0–4.4)
Cholesterol, Total: 164 mg/dL (ref 100–199)
HDL: 52 mg/dL (ref >39)
LDL Chol Calc (NIH): 95 mg/dL (ref 0–99)
Triglycerides: 92 mg/dL (ref 0–149)
VLDL Cholesterol Cal: 17 mg/dL (ref 5–40)

## 2024-01-21 ENCOUNTER — Ambulatory Visit (HOSPITAL_BASED_OUTPATIENT_CLINIC_OR_DEPARTMENT_OTHER): Payer: Self-pay | Admitting: Family Medicine

## 2024-01-30 ENCOUNTER — Other Ambulatory Visit (HOSPITAL_BASED_OUTPATIENT_CLINIC_OR_DEPARTMENT_OTHER): Payer: Self-pay

## 2024-01-30 MED ORDER — METHYLPREDNISOLONE 4 MG PO TBPK
ORAL_TABLET | ORAL | 0 refills | Status: DC
  Filled 2024-01-30: qty 21, 6d supply, fill #0

## 2024-02-06 ENCOUNTER — Ambulatory Visit
Admission: RE | Admit: 2024-02-06 | Discharge: 2024-02-06 | Disposition: A | Discharge status: Home / Self Care | Source: Ambulatory Visit | Attending: Family Medicine | Admitting: Family Medicine

## 2024-02-06 ENCOUNTER — Ambulatory Visit: Admitting: Podiatry

## 2024-02-06 DIAGNOSIS — M722 Plantar fascial fibromatosis: Secondary | ICD-10-CM

## 2024-02-06 DIAGNOSIS — Z1231 Encounter for screening mammogram for malignant neoplasm of breast: Secondary | ICD-10-CM | POA: Diagnosis not present

## 2024-02-06 NOTE — Progress Notes (Signed)
 Subjective:  Patient ID: Samantha Hayes, female    DOB: 1976-04-29,  MRN: [id number]  Chief Complaint  Patient presents with   Foot Pain    Right heel pain     48 y.o. female presents with the above complaint.  Patient presents with complaint of right heel pain that has been off for quite some time.  She states that she has been doing this for many years.  She has history of plantar fasciitis on the left side she underwent endoscopic plantar fasciotomy at [facility] on the left side.  Now her right side is becoming bothersome she wants to discuss treatment options for that pain scale 7 out of 10 dull aching nature.   Review of Systems: Negative except as noted in the HPI. Denies N/V/F/Ch.  Past Medical History:  Diagnosis Date   Allergy    Anxiety 2009   Cancer [facility]) 2009   cervical   Depression    No pertinent past medical history    Status post surgical removal of both fallopian tubes    Thyroid disease 2016    Current Outpatient Medications:    albuterol  (VENTOLIN  HFA) 108 (90 Base) MCG/ACT inhaler, Inhale 2 puffs into the lungs every 6 (six) hours as needed for wheezing or shortness of breath., Disp: 8 g, Rfl: 2   baclofen  (LIORESAL ) 10 MG tablet, Take 1 tablet (10 mg total) by mouth 3 (three) times daily. (Patient not taking: Reported on 01/15/2024), Disp: 60 each, Rfl: 0   clindamycin  (CLEOCIN  T) 1 % SWAB, Apply 1 application  topically daily., Disp: 120 each, Rfl: 2   desvenlafaxine  (PRISTIQ ) 50 MG 24 hr tablet, Take 1 tablet (50 mg total) by mouth daily., Disp: 90 tablet, Rfl: 1   estradiol  (VIVELLE -DOT) 0.1 MG/24HR patch, Place 1 patch (0.1 mg total) onto the skin 2 (two) times a week., Disp: 8 patch, Rfl: 12   fluticasone  (FLONASE ) 50 MCG/ACT nasal spray, Place 2 sprays into both nostrils daily. (Patient not taking: Reported on 01/15/2024), Disp: 16 g, Rfl: 0   gabapentin  (NEURONTIN ) 300 MG capsule, Take 1 capsule (300 mg total) by mouth at bedtime., Disp: 30 capsule, Rfl: 3    methylPREDNISolone  (MEDROL  DOSEPAK) 4 MG TBPK tablet, Use as directed per package directions., Disp: 21 tablet, Rfl: 0   Multiple Vitamin (MULTIVITAMIN) tablet, Take 1 tablet by mouth daily., Disp: , Rfl:    naproxen  (NAPROSYN ) 500 MG tablet, Take 1 tablet (500 mg total) by mouth 2 (two) times daily with a meal., Disp: 90 tablet, Rfl: 1  Social History   Tobacco Use  Smoking Status Never  Smokeless Tobacco Never    No Known Allergies Objective:  There were no vitals filed for this visit. There is no height or weight on file to calculate BMI. Constitutional Well developed. Well nourished.  Vascular Dorsalis pedis pulses palpable bilaterally. Posterior tibial pulses palpable bilaterally. Capillary refill normal to all digits.  No cyanosis or clubbing noted. Pedal hair growth normal.  Neurologic Normal speech. Oriented to person, place, and time. Epicritic sensation to light touch grossly present bilaterally.  Dermatologic Nails well groomed and normal in appearance. No open wounds. No skin lesions.  Orthopedic: Normal joint ROM without pain or crepitus bilaterally. No visible deformities. Tender to palpation at the calcaneal tuber right. No pain with calcaneal squeeze right. Ankle ROM diminished range of motion right. Silfverskiold Test: positive right.   Radiographs:   Assessment:   1. Plantar fasciitis of right foot    Plan:  Patient was evaluated and treated and all questions answered.  Left plantar fasciitis - History of endoscopic plantar fasciotomy on the left side and [facility]  Plantar Fasciitis, right - XR reviewed as above.  - Educated on icing and stretching. Instructions given.  - Injection delivered to the plantar fascia as below. - DME: Plantar fascial brace dispensed to support the medial longitudinal arch of the foot and offload pressure from the heel and prevent arch collapse during weightbearing - Pharmacologic management: None - Patient  already has a body still functioning well and wears good shoes.  Procedure: Injection Tendon/Ligament Location: Right plantar fascia at the glabrous junction; medial approach. Skin Prep: alcohol Injectate: 0.5 cc 0.5% marcaine plain, 0.5 cc of 1% Lidocaine , 0.5 cc kenalog 10. Disposition: Patient tolerated procedure well. Injection site dressed with a band-aid.  No follow-ups on file.

## 2024-02-10 ENCOUNTER — Encounter (HOSPITAL_BASED_OUTPATIENT_CLINIC_OR_DEPARTMENT_OTHER): Payer: Self-pay | Admitting: Certified Nurse Midwife

## 2024-02-10 ENCOUNTER — Ambulatory Visit (HOSPITAL_BASED_OUTPATIENT_CLINIC_OR_DEPARTMENT_OTHER): Admitting: Certified Nurse Midwife

## 2024-02-10 ENCOUNTER — Other Ambulatory Visit (HOSPITAL_BASED_OUTPATIENT_CLINIC_OR_DEPARTMENT_OTHER): Payer: Self-pay

## 2024-02-10 VITALS — BP 128/69 | HR 59 | Ht 66.0 in | Wt 208.6 lb

## 2024-02-10 DIAGNOSIS — N951 Menopausal and female climacteric states: Secondary | ICD-10-CM | POA: Diagnosis not present

## 2024-02-10 DIAGNOSIS — R232 Flushing: Secondary | ICD-10-CM | POA: Diagnosis not present

## 2024-02-10 DIAGNOSIS — R61 Generalized hyperhidrosis: Secondary | ICD-10-CM | POA: Diagnosis not present

## 2024-02-10 MED ORDER — ESTRADIOL 0.1 MG/24HR TD PTTW
1.0000 | MEDICATED_PATCH | TRANSDERMAL | 12 refills | Status: AC
  Filled 2024-02-10: qty 8, 28d supply, fill #0
  Filled 2024-03-04: qty 8, 28d supply, fill #1
  Filled 2024-04-01: qty 8, 28d supply, fill #2
  Filled 2024-04-28: qty 8, 28d supply, fill #3
  Filled 2024-06-09: qty 8, 28d supply, fill #4

## 2024-02-10 MED ORDER — GABAPENTIN 300 MG PO CAPS
300.0000 mg | ORAL_CAPSULE | Freq: Every day | ORAL | 3 refills | Status: DC
  Filled 2024-02-10 (×2): qty 30, 30d supply, fill #0

## 2024-02-10 NOTE — Progress Notes (Signed)
  GYNECOLOGY  VISIT  CC:   Hot Flashes, Night Sweats affecting ability to sleep  HPI: 48 y.o. G76P4 Married White or Caucasian female here for new problem gyn visit due to hot flashes, night sweats increasing in frequency. Pt started Pristiq  approx one year ago that seemed to help for awhile, but patient does not feel like Pristiq  is currently helping with hot flashes or night sweats. Hx Hysterectomy and unilateral oophorectomy. No hx Diabetes or HTN.   Past Medical History:  Diagnosis Date   Allergy    Anxiety 2009   Cancer [facility]) 2009   cervical   Depression    No pertinent past medical history    Status post surgical removal of both fallopian tubes    Thyroid disease 2016    MEDS:   Current Outpatient Medications on File Prior to Visit  Medication Sig Dispense Refill   desvenlafaxine  (PRISTIQ ) 50 MG 24 hr tablet Take 1 tablet (50 mg total) by mouth daily. 90 tablet 1   albuterol  (VENTOLIN  HFA) 108 (90 Base) MCG/ACT inhaler Inhale 2 puffs into the lungs every 6 (six) hours as needed for wheezing or shortness of breath. 8 g 2   baclofen  (LIORESAL ) 10 MG tablet Take 1 tablet (10 mg total) by mouth 3 (three) times daily. (Patient not taking: Reported on 01/15/2024) 60 each 0   clindamycin  (CLEOCIN  T) 1 % SWAB Apply 1 application  topically daily. 120 each 2   fluticasone  (FLONASE ) 50 MCG/ACT nasal spray Place 2 sprays into both nostrils daily. (Patient not taking: Reported on 01/15/2024) 16 g 0   methylPREDNISolone  (MEDROL  DOSEPAK) 4 MG TBPK tablet Use as directed per package directions. 21 tablet 0   Multiple Vitamin (MULTIVITAMIN) tablet Take 1 tablet by mouth daily.     naproxen  (NAPROSYN ) 500 MG tablet Take 1 tablet (500 mg total) by mouth 2 (two) times daily with a meal. 90 tablet 1   No current facility-administered medications on file prior to visit.    ALLERGIES: Patient has no known allergies.   Review of Systems  Constitutional:        +hot flashes, night sweats   Respiratory: Negative.    Cardiovascular: Negative.     PHYSICAL EXAMINATION:    BP 128/69   Pulse (!) 59   Ht 5' 6 (1.676 m) Comment: Reported  Wt 208 lb 9.6 oz (94.6 kg)   LMP 01/23/2012   BMI 33.67 kg/m     General appearance: alert, cooperative and appears stated age  Assessment/Plan: 1. Perimenopausal (Primary) - Discussed options for management hot flashes/night sweats/perimenopausal symptoms - Pt will trial Gabapentin  300mg  po at bedtime to reduce frequency and severity of night sweats (goal) x 1 month - Estradiol  Patch .1mg  twice weekly - No contraindications to HT noted.  2. Hot flash, menopausal   3. Night sweats  Pt will message provider in 1-2 weeks w/ update on symptoms/medication effectiveness. Samantha Hayes

## 2024-03-03 ENCOUNTER — Ambulatory Visit: Admitting: Dermatology

## 2024-03-10 ENCOUNTER — Ambulatory Visit: Admitting: Podiatry

## 2024-03-12 ENCOUNTER — Encounter (HOSPITAL_BASED_OUTPATIENT_CLINIC_OR_DEPARTMENT_OTHER): Admitting: Certified Nurse Midwife

## 2024-03-24 ENCOUNTER — Ambulatory Visit: Admitting: Podiatry

## 2024-03-24 DIAGNOSIS — M722 Plantar fascial fibromatosis: Secondary | ICD-10-CM

## 2024-03-24 NOTE — Progress Notes (Signed)
 Subjective:  Patient ID: Samantha Hayes, female    DOB: 07/27/1975,  MRN: [id number]  Chief Complaint  Patient presents with   Plantar Fasciitis    48 y.o. female presents with the above complaint.  Patient presents for follow-up of right plantar fascia she states the injection did not help.  She states she is still hurting lasted for few days came back again she would like to discuss next treatment plan denies any other acute issues   Review of Systems: Negative except as noted in the HPI. Denies N/V/F/Ch.  Past Medical History:  Diagnosis Date   Allergy    Anxiety 2009   Cancer [facility]) 2009   cervical   Depression    No pertinent past medical history    Status post surgical removal of both fallopian tubes    Thyroid disease 2016    Current Outpatient Medications:    albuterol  (VENTOLIN  HFA) 108 (90 Base) MCG/ACT inhaler, Inhale 2 puffs into the lungs every 6 (six) hours as needed for wheezing or shortness of breath., Disp: 8 g, Rfl: 2   baclofen  (LIORESAL ) 10 MG tablet, Take 1 tablet (10 mg total) by mouth 3 (three) times daily. (Patient not taking: Reported on 01/15/2024), Disp: 60 each, Rfl: 0   clindamycin  (CLEOCIN  T) 1 % SWAB, Apply 1 application  topically daily., Disp: 120 each, Rfl: 2   desvenlafaxine  (PRISTIQ ) 50 MG 24 hr tablet, Take 1 tablet (50 mg total) by mouth daily., Disp: 90 tablet, Rfl: 1   estradiol  (VIVELLE -DOT) 0.1 MG/24HR patch, Place 1 patch (0.1 mg total) onto the skin 2 (two) times a week., Disp: 8 patch, Rfl: 12   fluticasone  (FLONASE ) 50 MCG/ACT nasal spray, Place 2 sprays into both nostrils daily. (Patient not taking: Reported on 01/15/2024), Disp: 16 g, Rfl: 0   gabapentin  (NEURONTIN ) 300 MG capsule, Take 1 capsule (300 mg total) by mouth at bedtime., Disp: 30 capsule, Rfl: 3   methylPREDNISolone  (MEDROL  DOSEPAK) 4 MG TBPK tablet, Use as directed per package directions., Disp: 21 tablet, Rfl: 0   Multiple Vitamin (MULTIVITAMIN) tablet, Take 1 tablet by  mouth daily., Disp: , Rfl:    naproxen  (NAPROSYN ) 500 MG tablet, Take 1 tablet (500 mg total) by mouth 2 (two) times daily with a meal., Disp: 90 tablet, Rfl: 1  Social History   Tobacco Use  Smoking Status Never  Smokeless Tobacco Never    No Known Allergies Objective:  There were no vitals filed for this visit. There is no height or weight on file to calculate BMI. Constitutional Well developed. Well nourished.  Vascular Dorsalis pedis pulses palpable bilaterally. Posterior tibial pulses palpable bilaterally. Capillary refill normal to all digits.  No cyanosis or clubbing noted. Pedal hair growth normal.  Neurologic Normal speech. Oriented to person, place, and time. Epicritic sensation to light touch grossly present bilaterally.  Dermatologic Nails well groomed and normal in appearance. No open wounds. No skin lesions.  Orthopedic: Normal joint ROM without pain or crepitus bilaterally. No visible deformities. Tender to palpation at the calcaneal tuber right. No pain with calcaneal squeeze right. Ankle ROM diminished range of motion right. Silfverskiold Test: positive right.   Radiographs:   Assessment:   1. Plantar fasciitis of right foot     Plan:  Patient was evaluated and treated and all questions answered.  Left plantar fasciitis - History of endoscopic plantar fasciotomy on the left side and [facility]  Plantar Fasciitis, right - XR reviewed as above.  - Educated  on icing and stretching. Instructions given.  - No further injections that have not helped - DME: Cam boot immobilization - Pharmacologic management: None - Patient already has orthotics still functioning well and wears good shoes.  Procedure: Injection Tendon/Ligament Location: Right plantar fascia at the glabrous junction; medial approach. Skin Prep: alcohol Injectate: 0.5 cc 0.5% marcaine plain, 0.5 cc of 1% Lidocaine , 0.5 cc kenalog 10. Disposition: Patient tolerated procedure well.  Injection site dressed with a band-aid.  No follow-ups on file.

## 2024-03-29 ENCOUNTER — Encounter: Payer: Self-pay | Admitting: Podiatry

## 2024-03-30 ENCOUNTER — Other Ambulatory Visit (HOSPITAL_BASED_OUTPATIENT_CLINIC_OR_DEPARTMENT_OTHER): Payer: Self-pay

## 2024-03-30 ENCOUNTER — Other Ambulatory Visit: Payer: Self-pay | Admitting: Podiatry

## 2024-03-30 MED ORDER — METHYLPREDNISOLONE 4 MG PO TBPK
ORAL_TABLET | ORAL | 0 refills | Status: DC
  Filled 2024-03-30: qty 21, 6d supply, fill #0

## 2024-03-30 MED ORDER — MELOXICAM 15 MG PO TABS
15.0000 mg | ORAL_TABLET | Freq: Every day | ORAL | 0 refills | Status: DC
  Filled 2024-03-30: qty 30, 30d supply, fill #0

## 2024-04-02 ENCOUNTER — Other Ambulatory Visit (HOSPITAL_BASED_OUTPATIENT_CLINIC_OR_DEPARTMENT_OTHER): Payer: Self-pay

## 2024-04-02 ENCOUNTER — Other Ambulatory Visit (HOSPITAL_COMMUNITY): Payer: Self-pay

## 2024-04-05 ENCOUNTER — Encounter: Payer: Self-pay | Admitting: Radiology

## 2024-04-07 ENCOUNTER — Other Ambulatory Visit: Payer: Self-pay | Admitting: Medical Genetics

## 2024-04-07 DIAGNOSIS — Z006 Encounter for examination for normal comparison and control in clinical research program: Secondary | ICD-10-CM

## 2024-04-21 ENCOUNTER — Ambulatory Visit: Admitting: Podiatry

## 2024-04-21 DIAGNOSIS — M722 Plantar fascial fibromatosis: Secondary | ICD-10-CM

## 2024-04-21 DIAGNOSIS — M62461 Contracture of muscle, right lower leg: Secondary | ICD-10-CM | POA: Diagnosis not present

## 2024-04-21 DIAGNOSIS — Z01818 Encounter for other preprocedural examination: Secondary | ICD-10-CM | POA: Diagnosis not present

## 2024-04-21 NOTE — Progress Notes (Signed)
 Subjective:  Patient ID: Samantha Hayes, female    DOB: 1976/05/17,  MRN: [id number]  Chief Complaint  Patient presents with   Plantar Fasciitis    48 y.o. female presents with the above complaint.  Patient presents for follow-up of right plantar fasciitis she states that nothing has helped is continues to keep coming back continue to be bothersome she wishes to discuss surgical intervention as she has failed all conservative care.  She has failed to give modification injection cam boot immobilization.  She wishes to have surgical intervention.  Scale 7 out of 10  Review of Systems: Negative except as noted in the HPI. Denies N/V/F/Ch.  Past Medical History:  Diagnosis Date   Allergy    Anxiety 2009   Cancer [facility]) 2009   cervical   Depression    No pertinent past medical history    Status post surgical removal of both fallopian tubes    Thyroid disease 2016    Current Outpatient Medications:    meloxicam  (MOBIC ) 15 MG tablet, Take 1 tablet (15 mg total) by mouth daily., Disp: 30 tablet, Rfl: 0   methylPREDNISolone  (MEDROL  DOSEPAK) 4 MG TBPK tablet, Take 6 tablets by mouth on day 1, 5 tabs on day 2, 4 tabs on day 3, 3 tabs on day 4, 2 tabs on day 5, 1 tab on day 6. Then stop., Disp: 21 each, Rfl: 0   albuterol  (VENTOLIN  HFA) 108 (90 Base) MCG/ACT inhaler, Inhale 2 puffs into the lungs every 6 (six) hours as needed for wheezing or shortness of breath., Disp: 8 g, Rfl: 2   baclofen  (LIORESAL ) 10 MG tablet, Take 1 tablet (10 mg total) by mouth 3 (three) times daily. (Patient not taking: Reported on 01/15/2024), Disp: 60 each, Rfl: 0   clindamycin  (CLEOCIN  T) 1 % SWAB, Apply 1 application  topically daily., Disp: 120 each, Rfl: 2   desvenlafaxine  (PRISTIQ ) 50 MG 24 hr tablet, Take 1 tablet (50 mg total) by mouth daily., Disp: 90 tablet, Rfl: 1   estradiol  (VIVELLE -DOT) 0.1 MG/24HR patch, Place 1 patch (0.1 mg total) onto the skin 2 (two) times a week., Disp: 8 patch, Rfl: 12    fluticasone  (FLONASE ) 50 MCG/ACT nasal spray, Place 2 sprays into both nostrils daily. (Patient not taking: Reported on 01/15/2024), Disp: 16 g, Rfl: 0   gabapentin  (NEURONTIN ) 300 MG capsule, Take 1 capsule (300 mg total) by mouth at bedtime., Disp: 30 capsule, Rfl: 3   Multiple Vitamin (MULTIVITAMIN) tablet, Take 1 tablet by mouth daily., Disp: , Rfl:    naproxen  (NAPROSYN ) 500 MG tablet, Take 1 tablet (500 mg total) by mouth 2 (two) times daily with a meal., Disp: 90 tablet, Rfl: 1  Social History   Tobacco Use  Smoking Status Never  Smokeless Tobacco Never    No Known Allergies Objective:  There were no vitals filed for this visit. There is no height or weight on file to calculate BMI. Constitutional Well developed. Well nourished.  Vascular Dorsalis pedis pulses palpable bilaterally. Posterior tibial pulses palpable bilaterally. Capillary refill normal to all digits.  No cyanosis or clubbing noted. Pedal hair growth normal.  Neurologic Normal speech. Oriented to person, place, and time. Epicritic sensation to light touch grossly present bilaterally.  Dermatologic Nails well groomed and normal in appearance. No open wounds. No skin lesions.  Orthopedic: Normal joint ROM without pain or crepitus bilaterally. No visible deformities. Tender to palpation at the calcaneal tuber right. No pain with calcaneal squeeze right. Ankle  ROM diminished range of motion right. Silfverskiold Test: positive right.   Radiographs:   Assessment:   1. Plantar fasciitis of right foot   2. Gastrocnemius equinus of right lower extremity   3. Encounter for preoperative examination for general surgical procedure      Plan:  Patient was evaluated and treated and all questions answered.  Left plantar fasciitis - History of endoscopic plantar fasciotomy on the left side and [facility]  Plantar Fasciitis, right with underlying gastrocnemius equinus - Clinically at this time patient has  continued to fail all conservative care including shoe gear modification padding protecting injection cam boot immobilization she wishes to undergo surgical intervention.  She has had previous surgery on the left side now she wishes to undergo surgery on the right side I discussed Indica scopic plantar fasciotomy with gastrocnemius recession she states understanding like to proceed with surgery I discussed my preoperative intraoperative postoperative plan with the patient in extensive detail she states understand like to proceed with surgery -Informed surgical risk consent was reviewed and read aloud to the patient.  I reviewed the films.  I have discussed my findings with the patient in great detail.  I have discussed all risks including but not limited to infection, stiffness, scarring, limp, disability, deformity, damage to blood vessels and nerves, numbness, poor healing, need for braces, arthritis, chronic pain, amputation, death.  All benefits and realistic expectations discussed in great detail.  I have made no promises as to the outcome.  I have provided realistic expectations.  I have offered the patient a 2nd opinion, which they have declined and assured me they preferred to proceed despite the risks   No follow-ups on file.

## 2024-05-03 ENCOUNTER — Telehealth: Payer: Self-pay | Admitting: Podiatry

## 2024-05-03 NOTE — Telephone Encounter (Signed)
 Patient scheduled for surgery on 05/24/2024. Patient not on any GLP1 or blood thinners. Patient pharmacy correct in chart.

## 2024-05-12 ENCOUNTER — Telehealth: Payer: Self-pay | Admitting: Podiatry

## 2024-05-12 NOTE — Telephone Encounter (Signed)
 DOS- 05/24/2024  ENDOSCOPIC PLANTAR FASCIOTOMY RT- 70106 GASTROCNEMIUS RECESS RT- 72312  [employer] EFFECTIVE DATE- 07/05/2023  DEDUCTIBLE- $600 REMAINING- $398.63 FAMILY DEDUCTIBLE- $1200 REMAINING- $0 OOP- $9200 REMAINING- $8359.94 FAMILY OOP- $18400 REMAINING- $86525.29 COINSURANCE- 30%  PER AVAILITY PORTAL, NO PRIOR AUTHS ARE REQUIRED FOR CPT CODES 70106 AND [phone]. DOCUMENTATION ATTACHED TO SURGERY CONSENT PACKET.

## 2024-05-24 ENCOUNTER — Encounter: Payer: Self-pay | Admitting: Podiatry

## 2024-05-24 ENCOUNTER — Other Ambulatory Visit (HOSPITAL_BASED_OUTPATIENT_CLINIC_OR_DEPARTMENT_OTHER): Payer: Self-pay

## 2024-05-24 ENCOUNTER — Other Ambulatory Visit: Payer: Self-pay | Admitting: Podiatry

## 2024-05-24 DIAGNOSIS — M722 Plantar fascial fibromatosis: Secondary | ICD-10-CM | POA: Diagnosis not present

## 2024-05-24 DIAGNOSIS — M216X1 Other acquired deformities of right foot: Secondary | ICD-10-CM | POA: Diagnosis not present

## 2024-05-24 MED ORDER — IBUPROFEN 800 MG PO TABS
800.0000 mg | ORAL_TABLET | Freq: Four times a day (QID) | ORAL | 1 refills | Status: AC | PRN
  Filled 2024-05-24: qty 60, 15d supply, fill #0

## 2024-05-24 MED ORDER — OXYCODONE-ACETAMINOPHEN 5-325 MG PO TABS
1.0000 | ORAL_TABLET | ORAL | 0 refills | Status: AC | PRN
  Filled 2024-05-24: qty 30, 5d supply, fill #0

## 2024-05-24 NOTE — Telephone Encounter (Signed)
 Per MyChart mess, the pt is needing a work note to return to work 05/31/24. I sent mess to confirm what restrictions she is needing and where it needs to go.

## 2024-05-25 ENCOUNTER — Encounter: Payer: Self-pay | Admitting: Podiatry

## 2024-05-25 NOTE — Telephone Encounter (Signed)
 Emailed pt and CHRISTELLA Sharps RTW note for the pt RTW 05/31/24 with restrictions.

## 2024-05-26 ENCOUNTER — Encounter

## 2024-06-02 ENCOUNTER — Encounter: Payer: Self-pay | Admitting: Podiatry

## 2024-06-02 ENCOUNTER — Ambulatory Visit (INDEPENDENT_AMBULATORY_CARE_PROVIDER_SITE_OTHER): Admitting: Podiatry

## 2024-06-02 DIAGNOSIS — M62461 Contracture of muscle, right lower leg: Secondary | ICD-10-CM

## 2024-06-02 DIAGNOSIS — M722 Plantar fascial fibromatosis: Secondary | ICD-10-CM

## 2024-06-02 NOTE — Progress Notes (Signed)
 Patient presents for post-op visit today, POV #1 DOS 05/24/2024 RT EPF, RT GASTROCNEMIUS RECESS  Back of calf hurts. My daughter is a trauma nurse, we did change the gauze. The heel had gotten so hard. We rewrapped it as best we could at home. The boot is rubbing where the staples are on the back of the calf. I have been knee scooting at work this week..  Notes: n/a  Vital Signs: Today's Vitals   06/02/24 1236  PainSc: 3   PainLoc: Leg      Radiographs: []  Taken [x]  Not taken  Surgical Site Assessment:  - Dressing:  [x]  Minimal dry blood, intact []  Reinforced   []  Changed     -Notes: changed 05/30/24 by patient and daughter  - Incision:  []  CDI (clean, dry, intact)  [x]  Mild erythema  [x]  Drainage noted   -Notes: medial incision is dehisced and has some drainage. All other incisions intact  - Swelling:  []  None  []  Mild  [x]  Moderate   []  Significant     -Notes: n/a  - Bruising:  []  None  [x]  Present: medial ankle, posterior calf.    - Sutures/Staples:  []  None [x]  Intact  []  Removed Today  [x]  Plan to remove at next visit   -Cast/Splint/Pins: [x]  None []  Intact []  Removed Today []  Plan to remove at next visit []  Replaced  -Signs of infection:  [x]  None  []  Present - Describe: n/a  -DME:    []  None [x]  AFW []  Surgical shoe []  Cast  []  Splint  -Walking status:  [x]  Full WB  []  Partial WB  []  NWB  -Utilizing device:  [x]  None []  Knee Scooter []  Crutches []  Wheelchair    DVT assessment:  [x]  Denies symptoms []  Chest pain/SOB []  Pain in calf/redness/warmth   Redressed DSD and ace wrap. Educated on signs of infection, proper dressing care, pain management, and weight bearing status. Patient will contact provider with any new or worsening symptoms. The provider assessed the patient today and reviewed instructions regarding plan of care.

## 2024-06-04 LAB — GENECONNECT MOLECULAR SCREEN: Genetic Analysis Overall Interpretation: NEGATIVE

## 2024-06-09 ENCOUNTER — Other Ambulatory Visit (HOSPITAL_BASED_OUTPATIENT_CLINIC_OR_DEPARTMENT_OTHER): Payer: Self-pay

## 2024-06-16 ENCOUNTER — Ambulatory Visit: Admitting: Podiatry

## 2024-06-16 DIAGNOSIS — M62461 Contracture of muscle, right lower leg: Secondary | ICD-10-CM

## 2024-06-16 DIAGNOSIS — M722 Plantar fascial fibromatosis: Secondary | ICD-10-CM

## 2024-06-16 NOTE — Progress Notes (Signed)
 Patient presents for post-op visit today, POV #2 DOS 05/24/2024 RT EPF, RT GASTROCNEMIUS RECESS  Stitch on inside of heel is irritating. Still numb in toes..  Notes: n/a  Vital Signs: There were no vitals filed for this visit.    Radiographs: []  Taken [x]  Not taken  Surgical Site Assessment:  - Dressing:  []  Minimal dry blood, intact []  Reinforced   []  Changed     -Notes: no dressing in place  - Incision:  [x]  CDI (clean, dry, intact)  [x]  Mild erythema  []  Drainage noted   -Notes: n/a  - Swelling:  []  None  [x]  Mild  []  Moderate   []  Significant     -Notes: n/a  - Bruising:  [x]  None  []  Present: n/a   - Sutures/Staples:  []  None [x]  Intact  [x]  Removed Today  []  Plan to remove at next visit   -Cast/Splint/Pins: [x]  None []  Intact []  Removed Today []  Plan to remove at next visit []  Replaced  -Signs of infection:  [x]  None  []  Present - Describe: n/a  -DME:    []  None [x]  AFW []  Surgical shoe []  Cast  []  Splint  -Walking status:  []  Full WB  [x]  Partial WB  []  NWB  -Utilizing device:  []  None [x]  Knee Scooter []  Crutches []  Wheelchair    DVT assessment:  [x]  Denies symptoms []  Chest pain/SOB []  Pain in calf/redness/warmth   Redressed DSD and ace wrap. Educated on signs of infection, proper dressing care, pain management, and weight bearing status. Patient will contact provider with any new or worsening symptoms. The provider assessed the patient today and reviewed instructions regarding plan of care.

## 2024-06-21 ENCOUNTER — Encounter: Payer: Self-pay | Admitting: Podiatry

## 2024-06-22 ENCOUNTER — Other Ambulatory Visit: Payer: Self-pay | Admitting: Podiatry

## 2024-06-22 ENCOUNTER — Other Ambulatory Visit (HOSPITAL_BASED_OUTPATIENT_CLINIC_OR_DEPARTMENT_OTHER): Payer: Self-pay

## 2024-06-22 MED ORDER — METHYLPREDNISOLONE 4 MG PO TBPK
ORAL_TABLET | ORAL | 0 refills | Status: AC
  Filled 2024-06-22: qty 21, 6d supply, fill #0

## 2024-07-14 ENCOUNTER — Encounter: Admitting: Podiatry
